# Patient Record
Sex: Female | Born: 1970 | Race: White | Hispanic: No | Marital: Married | Smoking: Never smoker
Health system: Southern US, Community
[De-identification: ages and names within clinical notes are randomized; demographics above are authoritative.]

## PROBLEM LIST (undated history)

## (undated) DIAGNOSIS — G43909 Migraine, unspecified, not intractable, without status migrainosus: Secondary | ICD-10-CM

## (undated) DIAGNOSIS — G8929 Other chronic pain: Secondary | ICD-10-CM

## (undated) DIAGNOSIS — K589 Irritable bowel syndrome without diarrhea: Secondary | ICD-10-CM

## (undated) DIAGNOSIS — I10 Essential (primary) hypertension: Secondary | ICD-10-CM

## (undated) DIAGNOSIS — F329 Major depressive disorder, single episode, unspecified: Secondary | ICD-10-CM

## (undated) DIAGNOSIS — F419 Anxiety disorder, unspecified: Secondary | ICD-10-CM

## (undated) DIAGNOSIS — M3500 Sicca syndrome, unspecified: Secondary | ICD-10-CM

## (undated) DIAGNOSIS — E079 Disorder of thyroid, unspecified: Secondary | ICD-10-CM

## (undated) DIAGNOSIS — M797 Fibromyalgia: Secondary | ICD-10-CM

## (undated) DIAGNOSIS — F32A Depression, unspecified: Secondary | ICD-10-CM

## (undated) DIAGNOSIS — Z87442 Personal history of urinary calculi: Secondary | ICD-10-CM

## (undated) DIAGNOSIS — R51 Headache: Secondary | ICD-10-CM

## (undated) DIAGNOSIS — R519 Headache, unspecified: Secondary | ICD-10-CM

## (undated) DIAGNOSIS — M1990 Unspecified osteoarthritis, unspecified site: Secondary | ICD-10-CM

## (undated) HISTORY — DX: Headache, unspecified: R51.9

## (undated) HISTORY — DX: Unspecified osteoarthritis, unspecified site: M19.90

## (undated) HISTORY — PX: PARTIAL HYSTERECTOMY: SHX80

## (undated) HISTORY — DX: Disorder of thyroid, unspecified: E07.9

## (undated) HISTORY — DX: Irritable bowel syndrome, unspecified: K58.9

## (undated) HISTORY — DX: Personal history of urinary calculi: Z87.442

## (undated) HISTORY — DX: Major depressive disorder, single episode, unspecified: F32.9

## (undated) HISTORY — DX: Headache: R51

## (undated) HISTORY — DX: Anxiety disorder, unspecified: F41.9

## (undated) HISTORY — DX: Fibromyalgia: M79.7

## (undated) HISTORY — PX: DENTAL SURGERY: SHX609

## (undated) HISTORY — DX: Depression, unspecified: F32.A

## (undated) HISTORY — DX: Other chronic pain: G89.29

## (undated) HISTORY — DX: Migraine, unspecified, not intractable, without status migrainosus: G43.909

---
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
## 2001-07-10 ENCOUNTER — Emergency Department (HOSPITAL_COMMUNITY): Admission: EM | Admit: 2001-07-10 | Discharge: 2001-07-10 | Payer: Self-pay | Admitting: *Deleted

## 2002-07-08 ENCOUNTER — Encounter (INDEPENDENT_AMBULATORY_CARE_PROVIDER_SITE_OTHER): Payer: Self-pay | Admitting: *Deleted

## 2002-07-08 ENCOUNTER — Ambulatory Visit (HOSPITAL_BASED_OUTPATIENT_CLINIC_OR_DEPARTMENT_OTHER): Admission: RE | Admit: 2002-07-08 | Discharge: 2002-07-08 | Payer: Self-pay | Admitting: General Surgery

## 2004-09-26 ENCOUNTER — Other Ambulatory Visit: Admission: RE | Admit: 2004-09-26 | Discharge: 2004-09-26 | Payer: Self-pay | Admitting: Family Medicine

## 2005-04-25 ENCOUNTER — Other Ambulatory Visit: Admission: RE | Admit: 2005-04-25 | Discharge: 2005-04-25 | Payer: Self-pay | Admitting: Family Medicine

## 2007-07-22 LAB — CONVERTED CEMR LAB: Pap Smear: NORMAL

## 2007-08-13 ENCOUNTER — Ambulatory Visit (HOSPITAL_COMMUNITY): Admission: RE | Admit: 2007-08-13 | Discharge: 2007-08-13 | Payer: Self-pay | Admitting: Obstetrics and Gynecology

## 2008-04-13 ENCOUNTER — Emergency Department (HOSPITAL_COMMUNITY): Admission: EM | Admit: 2008-04-13 | Discharge: 2008-04-13 | Payer: Self-pay | Admitting: Emergency Medicine

## 2008-06-02 ENCOUNTER — Ambulatory Visit: Payer: Self-pay | Admitting: Internal Medicine

## 2008-06-02 DIAGNOSIS — G43909 Migraine, unspecified, not intractable, without status migrainosus: Secondary | ICD-10-CM | POA: Insufficient documentation

## 2008-06-02 DIAGNOSIS — Z87442 Personal history of urinary calculi: Secondary | ICD-10-CM | POA: Insufficient documentation

## 2008-06-02 DIAGNOSIS — R519 Headache, unspecified: Secondary | ICD-10-CM | POA: Insufficient documentation

## 2008-06-02 DIAGNOSIS — R5381 Other malaise: Secondary | ICD-10-CM | POA: Insufficient documentation

## 2008-06-02 DIAGNOSIS — R5383 Other fatigue: Secondary | ICD-10-CM | POA: Insufficient documentation

## 2008-06-02 DIAGNOSIS — R51 Headache: Secondary | ICD-10-CM | POA: Insufficient documentation

## 2008-06-02 DIAGNOSIS — J309 Allergic rhinitis, unspecified: Secondary | ICD-10-CM | POA: Insufficient documentation

## 2008-06-02 LAB — CONVERTED CEMR LAB
ALT: 20 units/L (ref 0–35)
AST: 25 units/L (ref 0–37)
Albumin: 3.7 g/dL (ref 3.5–5.2)
Alkaline Phosphatase: 68 units/L (ref 39–117)
BUN: 7 mg/dL (ref 6–23)
Basophils Absolute: 0.1 10*3/uL (ref 0.0–0.1)
Basophils Relative: 1.3 % (ref 0.0–3.0)
Bilirubin, Direct: 0.1 mg/dL (ref 0.0–0.3)
CO2: 29 meq/L (ref 19–32)
Calcium: 9.3 mg/dL (ref 8.4–10.5)
Chloride: 106 meq/L (ref 96–112)
Cholesterol: 144 mg/dL (ref 0–200)
Creatinine, Ser: 0.7 mg/dL (ref 0.4–1.2)
Eosinophils Absolute: 0 10*3/uL (ref 0.0–0.7)
Eosinophils Relative: 1 % (ref 0.0–5.0)
Folate: 8.8 ng/mL
Free T4: 0.8 ng/dL (ref 0.6–1.6)
GFR calc Af Amer: 122 mL/min
GFR calc non Af Amer: 101 mL/min
Glucose, Bld: 76 mg/dL (ref 70–99)
HCT: 34.5 % — ABNORMAL LOW (ref 36.0–46.0)
HDL: 32.7 mg/dL — ABNORMAL LOW (ref >39.0)
Hemoglobin: 12.1 g/dL (ref 12.0–15.0)
LDL Cholesterol: 86 mg/dL (ref 0–99)
Lymphocytes Relative: 40.1 % (ref 12.0–46.0)
MCHC: 34.9 g/dL (ref 30.0–36.0)
MCV: 91.7 fL (ref 78.0–100.0)
Monocytes Absolute: 0.4 10*3/uL (ref 0.1–1.0)
Monocytes Relative: 8.1 % (ref 3.0–12.0)
Neutro Abs: 2.5 10*3/uL (ref 1.4–7.7)
Neutrophils Relative %: 49.5 % (ref 43.0–77.0)
Platelets: 292 10*3/uL (ref 150–400)
Potassium: 4.8 meq/L (ref 3.5–5.1)
RBC: 3.76 M/uL — ABNORMAL LOW (ref 3.87–5.11)
RDW: 12.5 % (ref 11.5–14.6)
Sodium: 140 meq/L (ref 135–145)
T3, Free: 3.1 pg/mL (ref 2.3–4.2)
TSH: 1.05 microintl units/mL (ref 0.35–5.50)
Total Bilirubin: 0.6 mg/dL (ref 0.3–1.2)
Total CHOL/HDL Ratio: 4.4
Total Protein: 7.9 g/dL (ref 6.0–8.3)
Triglycerides: 125 mg/dL (ref 0–149)
VLDL: 25 mg/dL (ref 0–40)
Vitamin B-12: 445 pg/mL (ref 211–911)
WBC: 5 10*3/uL (ref 4.5–10.5)

## 2008-06-03 ENCOUNTER — Encounter: Payer: Self-pay | Admitting: Internal Medicine

## 2008-07-15 ENCOUNTER — Ambulatory Visit: Payer: Self-pay | Admitting: Internal Medicine

## 2008-07-15 DIAGNOSIS — L0203 Carbuncle of face: Secondary | ICD-10-CM

## 2008-07-15 DIAGNOSIS — L0202 Furuncle of face: Secondary | ICD-10-CM | POA: Insufficient documentation

## 2008-10-29 HISTORY — PX: OTHER SURGICAL HISTORY: SHX169

## 2008-11-23 ENCOUNTER — Telehealth (INDEPENDENT_AMBULATORY_CARE_PROVIDER_SITE_OTHER): Payer: Self-pay | Admitting: *Deleted

## 2009-06-06 ENCOUNTER — Ambulatory Visit: Payer: Self-pay | Admitting: Internal Medicine

## 2009-06-06 DIAGNOSIS — K5909 Other constipation: Secondary | ICD-10-CM | POA: Insufficient documentation

## 2009-06-06 DIAGNOSIS — L0390 Cellulitis, unspecified: Secondary | ICD-10-CM

## 2009-06-06 DIAGNOSIS — L0291 Cutaneous abscess, unspecified: Secondary | ICD-10-CM | POA: Insufficient documentation

## 2009-06-06 LAB — CONVERTED CEMR LAB
BUN: 12 mg/dL (ref 6–23)
Basophils Absolute: 0 10*3/uL (ref 0.0–0.1)
Basophils Relative: 1 % (ref 0–1)
CO2: 24 meq/L (ref 19–32)
Calcium: 9.2 mg/dL (ref 8.4–10.5)
Chloride: 104 meq/L (ref 96–112)
Creatinine, Ser: 0.78 mg/dL (ref 0.40–1.20)
Eosinophils Absolute: 0.1 10*3/uL (ref 0.0–0.7)
Eosinophils Relative: 2 % (ref 0–5)
Free T4: 0.96 ng/dL (ref 0.80–1.80)
Glucose, Bld: 94 mg/dL (ref 70–99)
HCT: 39.1 % (ref 36.0–46.0)
Hemoglobin: 12.9 g/dL (ref 12.0–15.0)
Lymphocytes Relative: 39 % (ref 12–46)
Lymphs Abs: 2.3 10*3/uL (ref 0.7–4.0)
MCHC: 33 g/dL (ref 30.0–36.0)
MCV: 92.4 fL (ref 78.0–100.0)
Monocytes Absolute: 0.5 10*3/uL (ref 0.1–1.0)
Monocytes Relative: 8 % (ref 3–12)
Neutro Abs: 3.1 10*3/uL (ref 1.7–7.7)
Neutrophils Relative %: 51 % (ref 43–77)
Platelets: 281 10*3/uL (ref 150–400)
Potassium: 4.6 meq/L (ref 3.5–5.3)
RBC: 4.23 M/uL (ref 3.87–5.11)
RDW: 12.9 % (ref 11.5–15.5)
Sodium: 139 meq/L (ref 135–145)
TSH: 1.28 microintl units/mL (ref 0.350–4.500)
WBC: 6 10*3/uL (ref 4.0–10.5)

## 2009-06-08 ENCOUNTER — Telehealth: Payer: Self-pay | Admitting: Internal Medicine

## 2009-08-04 ENCOUNTER — Ambulatory Visit: Payer: Self-pay | Admitting: Gastroenterology

## 2009-09-07 ENCOUNTER — Ambulatory Visit: Payer: Self-pay | Admitting: Family

## 2009-09-07 DIAGNOSIS — J019 Acute sinusitis, unspecified: Secondary | ICD-10-CM | POA: Insufficient documentation

## 2009-09-07 DIAGNOSIS — H6590 Unspecified nonsuppurative otitis media, unspecified ear: Secondary | ICD-10-CM | POA: Insufficient documentation

## 2009-09-12 ENCOUNTER — Ambulatory Visit: Payer: Self-pay | Admitting: Internal Medicine

## 2009-10-29 HISTORY — PX: KNEE ARTHROPLASTY: SHX992

## 2010-11-20 ENCOUNTER — Encounter (HOSPITAL_COMMUNITY): Payer: Self-pay | Admitting: Obstetrics and Gynecology

## 2010-11-28 NOTE — Letter (Signed)
   [place] [facility] [address] [place], [place]  [address]  [place] Phone: [phone]      June 03, 2008   [facility] [address] [place], [place] [address]  RE:  LAB RESULTS  Dear  Ms. Yetta Barre,  The following is an interpretation of your most recent lab tests.  Please take note of any instructions provided or changes to medications that have resulted from your lab work.  ELECTROLYTES:  Good - no changes needed  KIDNEY FUNCTION TESTS:  Good - no changes needed  LIVER FUNCTION TESTS:  Good - no changes needed  LIPID PANEL:  Fair - review at your next visit Triglyceride: 125   Cholesterol: 144   LDL: 86   HDL: 32.7   Chol/HDL%:  4.4 CALC  THYROID STUDIES:  Thyroid studies normal TSH: 1.05     CBC:  Good - no changes needed  B12 level - normal.       Sincerely Yours,    Dr. Thomos Lemons

## 2010-11-28 NOTE — Assessment & Plan Note (Signed)
Summary: NEW PT CPX-[facility]   Vital Signs:  Patient Profile:   40 Years Old Female Height:     70 inches Weight:      200.75 pounds BMI:     28.91 Temp:     98.5 degrees F oral Pulse rate:   80 / minute Pulse rhythm:   regular Resp:     16 per minute BP sitting:   110 / 80  (right arm)  Vitals Entered By: Glendell Docker CMA (June 02, 2008 11:26 AM)                 Chief Complaint:  New Tamara Moore.  History of Present Illness: 40 y/o white female to establish primary care.  Pt has history of chronic migraine headaches.  She gets approximately one headache per week.  Her headaches are preceded by visual aura.  She has associated nausea and photophobia.  She currently takes Excedrin migraine as needed.  Pt also reports history of mild goiter discovered by her GYN.  She reports thyroid u/s normal but discovered small 5 mm hypoechoic nodule.  She complains of recent fatigue and anxiety.  She works as [employer] officer.  She is having trouble concentrating and completing tasks w/o becoming distracted.  Her symptoms has worsened over last 1 year.  She reports chronic hx of difficulty sleeping.  Insomnia also more apparent over last 1 - 2 years.  She is feeling somewhat stressed due to recent wt loss program.  She is exercising frequently.  She has occasional bouts of brief dizziness when she has not eaten for several hours.    Current Allergies (reviewed today): ! ASA  Past Medical History:    History of adjustment disorder with depression - after divorse 5 years.         Pt took wellbutrin for short time period.    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   Head:     normocephalic and atraumatic.   Eyes:     vision grossly intact, pupils equal, pupils round, and pupils reactive to light.   Ears:     R ear normal and L ear normal.   Mouth:     Oral mucosa and oropharynx without lesions or exudates.  Teeth in good repair. Neck:     supple and no masses.  thyroid slightly full.  no nodules Lungs:     normal respiratory effort and normal breath sounds.   Heart:     normal rate, regular rhythm, and  no gallop.   Abdomen:     soft, non-tender, no hepatomegaly, and no splenomegaly.   Pulses:     dorsalis pedis and posterior tibial pulses are full and equal bilaterally Extremities:     No clubbing, cyanosis, edema   Neurologic:     alert & oriented X3 and cranial nerves II-XII intact.   Skin:     turgor normal and color normal.   Psych:     normally interactive and good eye contact.  slightly hyperactive.    Impression & Recommendations:  Problem # 1:  HEALTH MAINTENANCE EXAM (ICD-V70.0) Reviewed adult health maintenance protocols.  Pt is up to date with PAP and Pelvic.   Update with Tdap.  I urged pt to continue wt loss efforts.  Screen for diabetes.    Orders: TLB-BMP (Basic Metabolic Panel-BMET) (80048-METABOL) TLB-B12 + Folate Pnl (16109_60454-U98/JXB) TLB-CBC Platelet - w/Differential (85025-CBCD) TLB-Hepatic/Liver Function Pnl (80076-HEPATIC) TLB-Lipid Panel (80061-LIPID) TLB-TSH (Thyroid Stimulating Hormone) (84443-TSH) TLB-T4 (Thyrox), Free [phone]) TLB-T3, Free (Triiodothyronine) (84481-T3FREE)   Problem # 2:  FATIGUE (ICD-780.79) Pt with poor sleep.  She also mentions difficulty concentrating.  She  is easily distracted.  ? ADD.  We discussed possible referral to psychiatrist for formal testing.  Orders: TLB-BMP (Basic Metabolic Panel-BMET) (80048-METABOL) TLB-B12 + Folate Pnl ([id number]-N62/XBM) TLB-CBC Platelet - w/Differential (85025-CBCD) TLB-Hepatic/Liver Function Pnl (80076-HEPATIC) TLB-Lipid Panel (80061-LIPID) TLB-TSH (Thyroid Stimulating Hormone) (84443-TSH) TLB-T4 (Thyrox), Free (84439-FT4R) TLB-T3, Free (Triiodothyronine) (84481-T3FREE)   Problem # 3:  MIGRAINE HEADACHE (ICD-346.90) Pt reports 1 headache per week.  She uses excedrin migraine.  She has never use med for migraine prophylaxis.  Trial of amitriptyline 25 mg by mouth at bedtime.  We discussed common side effects.  Complete Medication List: 1)  Amitriptyline Hcl 25 Mg Tabs (Amitriptyline hcl) .... One by mouth qhs  Other Orders: Tdap => 90yrs IM (84132) Admin 1st Vaccine (44010)   Patient Instructions: 1)  Please schedule a follow-up appointment in 6 weeks.   Prescriptions: AMITRIPTYLINE HCL 25 MG  TABS (AMITRIPTYLINE HCL) one by mouth qhs  #30 x 1   Entered and Authorized by:   D. Thomos Lemons DO   Signed by:   D. Thomos Lemons DO on 06/02/2008   Method used:   Electronically sent to ...       [facility]       [address]       [place], [place]  [address]       Ph: [phone]       Fax: [phone]   RxID:   [id number]  ] Current Allergies (reviewed today): ! ASA   Preventive Care Screening  Pap Smear:    Date:  07/22/2007    Results:  normal    Tetanus/Td Vaccine    Vaccine Type: Tdap    Site: left deltoid    Mfr: Sanofi Pasteur    Dose: 0.5 ml    Route: IM    Given by: Glendell Docker CMA    Exp. Date: 01/27/2010    Lot #: [id number]    VIS given: 09/16/07 version given June 02, 2008.

## 2011-03-16 NOTE — Op Note (Signed)
   Tamara Moore, Tamara Moore NO.:  [id number]   MEDICAL RECORD NO.:  [id number]                   PATIENT TYPE:  AMB   LOCATION:  DSC                                  FACILITY:  [facility]   PHYSICIAN:  Ollen Gross. Vernell Morgans, M.D.              DATE OF BIRTH:  December 17, 1970   DATE OF PROCEDURE:  07/12/2002  DATE OF DISCHARGE:  07/08/2002                                 OPERATIVE REPORT   PREOPERATIVE DIAGNOSIS:  Mass on the right buttock.   POSTOPERATIVE DIAGNOSIS:  Mass on the right buttock.   PROCEDURE:  Excision of 2 cm mass from the right buttock.   SURGEON:  Ollen Gross. Carolynne Edouard, M.D.   ANESTHESIA:  Local.   DESCRIPTION OF PROCEDURE:  After informed consent was obtained, the patient  was brought to the operating room and placed in the prone position on the  operating table.  The area in question was prepped with Betadine and draped  in the usual sterile manner.  Then 1% lidocaine with epinephrine was then  injected into the surrounding tissue until a good field block was obtained.  A longitudinally oriented incision was made overlying the mass.  This  incision was carried down through the skin and subcutaneous tissue with the  knife.  The mass was then removed by sharp dissection with the Metzenbaum  scissors until it was completely separated from the rest of the tissue.  The  wound was then examined and found to be hemostatic.  The deep layers were  closed with interrupted 4-0 Monocryl stitches and a Dermabond dressing was  applied.  The patient tolerated the procedure well.  At the end of the case,  all needle, sponge and instrument counts were correct.  The patient was then  taken to the recovery room in stable condition.                                               Ollen Gross. Vernell Morgans, M.D.    PST/MEDQ  D:  07/12/2002  T:  07/12/2002  Job:  [id number]

## 2011-07-26 LAB — URINE CULTURE: Colony Count: 15000

## 2011-07-26 LAB — DIFFERENTIAL
Basophils Absolute: 0
Basophils Relative: 0
Eosinophils Absolute: 0
Eosinophils Relative: 0
Lymphocytes Relative: 11 — ABNORMAL LOW
Lymphs Abs: 1
Monocytes Absolute: 0.3
Monocytes Relative: 3
Neutro Abs: 7.9 — ABNORMAL HIGH
Neutrophils Relative %: 86 — ABNORMAL HIGH

## 2011-07-26 LAB — POCT I-STAT, CHEM 8
BUN: 9
Calcium, Ion: 1.14
Chloride: 106
Creatinine, Ser: 0.8
Glucose, Bld: 125 — ABNORMAL HIGH
HCT: 33 — ABNORMAL LOW
Hemoglobin: 11.2 — ABNORMAL LOW
Potassium: 3.5
Sodium: 140
TCO2: 24

## 2011-07-26 LAB — CBC
HCT: 31.8 — ABNORMAL LOW
Hemoglobin: 11.1 — ABNORMAL LOW
MCHC: 34.7
MCV: 88.9
Platelets: 258
RBC: 3.58 — ABNORMAL LOW
RDW: 13.9
WBC: 9.1

## 2011-07-26 LAB — URINALYSIS, ROUTINE W REFLEX MICROSCOPIC
Bilirubin Urine: NEGATIVE
Glucose, UA: NEGATIVE
Ketones, ur: NEGATIVE
Nitrite: NEGATIVE
Protein, ur: 30 — AB
Specific Gravity, Urine: 1.022
Urobilinogen, UA: 0.2
pH: 5.5

## 2011-07-26 LAB — URINE MICROSCOPIC-ADD ON

## 2011-07-26 LAB — POCT PREGNANCY, URINE
Operator id: 295131
Preg Test, Ur: NEGATIVE

## 2011-09-17 ENCOUNTER — Ambulatory Visit (INDEPENDENT_AMBULATORY_CARE_PROVIDER_SITE_OTHER): Payer: BC Managed Care – PPO | Admitting: Internal Medicine

## 2011-09-17 ENCOUNTER — Encounter: Payer: Self-pay | Admitting: Internal Medicine

## 2011-09-17 ENCOUNTER — Ambulatory Visit: Payer: Self-pay | Admitting: Internal Medicine

## 2011-09-17 DIAGNOSIS — R112 Nausea with vomiting, unspecified: Secondary | ICD-10-CM

## 2011-09-17 DIAGNOSIS — R51 Headache: Secondary | ICD-10-CM

## 2011-09-17 DIAGNOSIS — B9789 Other viral agents as the cause of diseases classified elsewhere: Secondary | ICD-10-CM

## 2011-09-17 DIAGNOSIS — B349 Viral infection, unspecified: Secondary | ICD-10-CM | POA: Insufficient documentation

## 2011-09-17 LAB — SEDIMENTATION RATE: Sed Rate: 34 mm/hr — ABNORMAL HIGH (ref 0–22)

## 2011-09-17 LAB — CBC WITH DIFFERENTIAL/PLATELET
Basophils Absolute: 0 10*3/uL (ref 0.0–0.1)
Basophils Relative: 0.1 % (ref 0.0–3.0)
Eosinophils Absolute: 0 10*3/uL (ref 0.0–0.7)
Eosinophils Relative: 0.2 % (ref 0.0–5.0)
HCT: 35.8 % — ABNORMAL LOW (ref 36.0–46.0)
Hemoglobin: 12 g/dL (ref 12.0–15.0)
Lymphocytes Relative: 13.4 % (ref 12.0–46.0)
Lymphs Abs: 0.6 10*3/uL — ABNORMAL LOW (ref 0.7–4.0)
MCHC: 33.7 g/dL (ref 30.0–36.0)
MCV: 91.1 fl (ref 78.0–100.0)
Monocytes Absolute: 0.2 10*3/uL (ref 0.1–1.0)
Monocytes Relative: 4 % (ref 3.0–12.0)
Neutro Abs: 3.6 10*3/uL (ref 1.4–7.7)
Neutrophils Relative %: 82.3 % — ABNORMAL HIGH (ref 43.0–77.0)
Platelets: 287 10*3/uL (ref 150.0–400.0)
RBC: 3.93 Mil/uL (ref 3.87–5.11)
RDW: 12.9 % (ref 11.5–14.6)
WBC: 4.4 10*3/uL — ABNORMAL LOW (ref 4.5–10.5)

## 2011-09-17 LAB — BASIC METABOLIC PANEL
BUN: 10 mg/dL (ref 6–23)
CO2: 26 mEq/L (ref 19–32)
Calcium: 8.5 mg/dL (ref 8.4–10.5)
Chloride: 103 mEq/L (ref 96–112)
Creatinine, Ser: 0.6 mg/dL (ref 0.4–1.2)
GFR: 115.74 mL/min (ref >60.00)
Glucose, Bld: 114 mg/dL — ABNORMAL HIGH (ref 70–99)
Potassium: 4.2 mEq/L (ref 3.5–5.1)
Sodium: 135 mEq/L (ref 135–145)

## 2011-09-17 MED ORDER — ONDANSETRON HCL 4 MG PO TABS: 4.0000 mg | ORAL_TABLET | Freq: Three times a day (TID) | ORAL | Status: AC | PRN

## 2011-09-17 MED ORDER — CEFUROXIME AXETIL 500 MG PO TABS: 500.0000 mg | ORAL_TABLET | Freq: Two times a day (BID) | ORAL | Status: DC

## 2011-09-17 MED ORDER — CEFUROXIME AXETIL 500 MG PO TABS: 500.0000 mg | ORAL_TABLET | Freq: Two times a day (BID) | ORAL | Status: AC

## 2011-09-17 MED ORDER — HYDROCODONE-ACETAMINOPHEN 5-500 MG PO CAPS: 1.0000 | ORAL_CAPSULE | Freq: Three times a day (TID) | ORAL | Status: AC | PRN

## 2011-09-17 MED ORDER — PROMETHAZINE HCL 25 MG/ML IJ SOLN
12.5000 mg | Freq: Once | INTRAMUSCULAR | Status: AC
  Administered 2011-09-17: 12.5 mg via INTRAMUSCULAR

## 2011-09-17 NOTE — Progress Notes (Signed)
Addended by: Simeon Craft on: 09/17/2011 06:08 PM   Modules accepted: Orders

## 2011-09-17 NOTE — Patient Instructions (Signed)
Increase fluid intake Take nausea medication as directed. Use imodium over the counter for diarrhea Please call our office if your symptoms do not improve or gets worse within next 24 - 48 hrs.

## 2011-09-17 NOTE — Progress Notes (Signed)
Addended by: Alfred Levins D on: 09/17/2011 03:32 PM   Modules accepted: Orders

## 2011-09-17 NOTE — Progress Notes (Signed)
  Subjective:    Patient ID: Tamara Moore, female    DOB: 1971/03/30, 40 y.o.   MRN: [id number]  HPI  40 year old white female complains of headache x2 days. She has history of migraines but reports this headache feels different. She has had associated nausea and vomiting that started yesterday as well as diarrhea 4-5 times per day. She denies blood in her stool. She denies fever. Severity of headache is rated 8/10. It is currently left-sided. She does have associated photophobia. She denies sick contacts. She took some Advil migraine yesterday which did alleviate her headache somewhat.  Review of Systems  she feels achy all over  No past medical history on file.  History   Social History  . Marital Status: Divorced    Spouse Name: N/A    Number of Children: N/A  . Years of Education: N/A   Occupational History  . Not on file.   Social History Main Topics  . Smoking status: Never Smoker   . Smokeless tobacco: Not on file  . Alcohol Use: Yes  . Drug Use: No  . Sexually Active: Not on file   Other Topics Concern  . Not on file   Social History Narrative  . No narrative on file    Past Surgical History  Procedure Date  . Cesarean section   . Eye surgery 1979    Family History  Problem Relation Age of Onset  . Stroke Mother   . Hypertension Mother   . Hyperlipidemia Mother   . Hypertension Father   . Hyperlipidemia Father     Allergies  Allergen Reactions  . Aspirin     No current outpatient prescriptions on file prior to visit.    BP 132/94  Temp(Src) 97.8 F (36.6 C) (Oral)  Ht 5\' 10"  (1.778 m)  Wt 184 lb (83.462 kg)  BMI 26.40 kg/m2       Objective:   Physical Exam  Constitutional: She appears well-developed and well-nourished.  HENT:  Head: Normocephalic and atraumatic.  Right Ear: External ear normal.  Left Ear: External ear normal.  Eyes: Conjunctivae are normal. Pupils are equal, round, and reactive to light.  Neck: Normal range of  motion. Neck supple.  Cardiovascular: Normal rate, regular rhythm and normal heart sounds.   Pulmonary/Chest: Effort normal and breath sounds normal.  Abdominal: Soft. She exhibits no mass. There is no rebound and no guarding.       Mild lower abdominal tenderness  Lymphadenopathy:    She has no cervical adenopathy.  Neurological: She is alert. No cranial nerve deficit. Coordination normal.  Skin: Skin is warm and dry.       Assessment & Plan:

## 2011-09-17 NOTE — Progress Notes (Signed)
Addended by: Simeon Craft on: 09/17/2011 06:18 PM   Modules accepted: Orders

## 2011-09-17 NOTE — Assessment & Plan Note (Addendum)
40 year old white female who presents with severe headache, nausea vomiting and diarrhea. I suspect her symptoms are consistent with a viral syndrome. I advised patient to increase fluid intake use anti-emetics and Imodium over-the-counter as directed.  Patient phenergan 12.5 mg IM x 1. Check CBC differential, sed rate and basic metabolic panel. Patient advised to call office if symptoms persist or worsen.  Addendum 09/17/2011 Pt has slightly left shift on CBCD and mild elevated sed rate.  Pt advised to take cefuroxime 500 mg bid x 10 days.

## 2011-09-18 ENCOUNTER — Emergency Department (HOSPITAL_COMMUNITY)
Admission: EM | Admit: 2011-09-18 | Discharge: 2011-09-19 | Disposition: A | Discharge status: Home / Self Care | Payer: BC Managed Care – PPO | Attending: Emergency Medicine | Admitting: Emergency Medicine

## 2011-09-18 ENCOUNTER — Encounter (HOSPITAL_COMMUNITY): Payer: Self-pay | Admitting: Emergency Medicine

## 2011-09-18 DIAGNOSIS — Z79899 Other long term (current) drug therapy: Secondary | ICD-10-CM | POA: Insufficient documentation

## 2011-09-18 DIAGNOSIS — M2569 Stiffness of other specified joint, not elsewhere classified: Secondary | ICD-10-CM | POA: Insufficient documentation

## 2011-09-18 DIAGNOSIS — R6883 Chills (without fever): Secondary | ICD-10-CM | POA: Insufficient documentation

## 2011-09-18 DIAGNOSIS — H53149 Visual discomfort, unspecified: Secondary | ICD-10-CM | POA: Insufficient documentation

## 2011-09-18 DIAGNOSIS — Z9889 Other specified postprocedural states: Secondary | ICD-10-CM | POA: Insufficient documentation

## 2011-09-18 DIAGNOSIS — R112 Nausea with vomiting, unspecified: Secondary | ICD-10-CM | POA: Insufficient documentation

## 2011-09-18 DIAGNOSIS — G43909 Migraine, unspecified, not intractable, without status migrainosus: Secondary | ICD-10-CM | POA: Insufficient documentation

## 2011-09-18 DIAGNOSIS — R630 Anorexia: Secondary | ICD-10-CM | POA: Insufficient documentation

## 2011-09-18 MED ORDER — DEXAMETHASONE SODIUM PHOSPHATE 10 MG/ML IJ SOLN
10.0000 mg | Freq: Once | INTRAMUSCULAR | Status: AC
  Administered 2011-09-19: 10 mg via INTRAVENOUS
  Filled 2011-09-18: qty 1

## 2011-09-18 MED ORDER — SODIUM CHLORIDE 0.9 % IV BOLUS (SEPSIS)
1000.0000 mL | Freq: Once | INTRAVENOUS | Status: AC
  Administered 2011-09-19: 1000 mL via INTRAVENOUS

## 2011-09-18 MED ORDER — DIPHENHYDRAMINE HCL 50 MG/ML IJ SOLN
25.0000 mg | Freq: Once | INTRAMUSCULAR | Status: AC
  Administered 2011-09-19: 25 mg via INTRAVENOUS
  Filled 2011-09-18: qty 1

## 2011-09-18 MED ORDER — METOCLOPRAMIDE HCL 5 MG/ML IJ SOLN
10.0000 mg | Freq: Once | INTRAMUSCULAR | Status: AC
  Administered 2011-09-19: 10 mg via INTRAVENOUS
  Filled 2011-09-18: qty 2

## 2011-09-18 NOTE — ED Notes (Signed)
PT. REPORTS HEADACHE ONSET 3 DAYS AGO WITH PHOTOSENSITIVITY , BACK OF NECK PAIN ,  VOMITTING /DIARRHEA,  FEVER , DENIES CHILLS , SEEN BY DR. Artist Pais , PRESCRIBED WITH ANTIEMETIC AND PAIN MEDICATIONS / ANTIBIOTICS FOR ELEVATED WBC.

## 2011-09-18 NOTE — ED Provider Notes (Signed)
History     CSN: [id number] Arrival date & time: 09/18/2011 10:36 PM   First MD Initiated Contact with Patient 09/18/11 2329      Chief Complaint  Patient presents with  . Headache    (Consider location/radiation/quality/duration/timing/severity/associated sxs/prior treatment) HPI Comments: Pt placed on abx on Monday for leukocytosis and HA by PCP however looking at chart WBC count was 4.4  Patient is a 40 y.o. female presenting with headaches. The history is provided by the patient and the spouse.  Headache  This is a recurrent (this one is different from normal) problem. The current episode started more than 2 days ago. The problem occurs constantly. The problem has been gradually worsening. The headache is associated with bright light, loud noise and the menstrual cycle. The pain is located in the left unilateral region. The quality of the pain is described as sharp and throbbing. The pain is at a severity of 9/10. The pain is severe. The pain radiates to the left neck and right neck. Associated symptoms include anorexia, nausea and vomiting. Pertinent negatives include no fever, no palpitations and no shortness of breath. Associated symptoms comments: chills. She has tried acetaminophen, oral narcotic analgesics and NSAIDs for the symptoms. The treatment provided no relief.    History reviewed. No pertinent past medical history.  Past Surgical History  Procedure Date  . Cesarean section   . Eye surgery 1979  . Knee arthroplasty     Family History  Problem Relation Age of Onset  . Stroke Mother   . Hypertension Mother   . Hyperlipidemia Mother   . Hypertension Father   . Hyperlipidemia Father     History  Substance Use Topics  . Smoking status: Never Smoker   . Smokeless tobacco: Not on file  . Alcohol Use: Yes    OB History    Grav Para Term Preterm Abortions TAB SAB Ect Mult Living                  Review of Systems  Constitutional: Positive for chills.  Negative for fever.  HENT: Positive for neck stiffness.   Respiratory: Negative for shortness of breath.   Cardiovascular: Negative for palpitations.  Gastrointestinal: Positive for nausea, vomiting and anorexia.  Neurological: Positive for headaches.  All other systems reviewed and are negative.    Allergies  Aspirin  Home Medications   Current Outpatient Rx  Name Route Sig Dispense Refill  . CEFUROXIME AXETIL 500 MG PO TABS Oral Take 1 tablet (500 mg total) by mouth 2 (two) times daily. 20 tablet 0  . HYDROCODONE-ACETAMINOPHEN 5-500 MG PO CAPS Oral Take 1 capsule by mouth every 8 (eight) hours as needed (Headache). 30 capsule 0  . ONDANSETRON HCL 4 MG PO TABS Oral Take 1 tablet (4 mg total) by mouth every 8 (eight) hours as needed for nausea. 30 tablet 0    BP 137/93  Pulse 95  Temp 98 F (36.7 C)  Resp 20  SpO2 96%  Physical Exam  Nursing note and vitals reviewed. Constitutional: She is oriented to person, place, and time. She appears well-developed and well-nourished. She appears distressed.  HENT:  Head: Normocephalic and atraumatic.  Eyes: Conjunctivae and EOM are normal. Pupils are equal, round, and reactive to light.  Neck: Normal range of motion. Neck supple.       Pain with flexion of the neck but not rigid and no meningeal signs  Cardiovascular: Normal rate, regular rhythm, normal heart sounds and intact distal  pulses.  Exam reveals no friction rub.   No murmur heard. Pulmonary/Chest: Effort normal and breath sounds normal. She has no wheezes. She has no rales.  Abdominal: Soft. Bowel sounds are normal. She exhibits no distension. There is no tenderness. There is no rebound and no guarding.  Musculoskeletal: Normal range of motion. She exhibits no tenderness.       No edema  Lymphadenopathy:    She has no cervical adenopathy.  Neurological: She is alert and oriented to person, place, and time. She has normal strength. No cranial nerve deficit or sensory  deficit. Coordination and gait normal.       photophobia  Skin: Skin is warm and dry. No rash noted.  Psychiatric: She has a normal mood and affect. Her behavior is normal.    ED Course  Procedures (including critical care time)   Labs Reviewed  CBC  DIFFERENTIAL  BASIC METABOLIC PANEL  CSF CELL COUNT WITH DIFFERENTIAL  PROTEIN AND GLUCOSE, CSF  CSF CULTURE   Ct Head Wo Contrast  09/19/2011  *RADIOLOGY REPORT*  Clinical Data: Headaches.  Nausea and vomiting.  CT HEAD WITHOUT CONTRAST 09/19/2011:  Technique:  Contiguous axial images were obtained from the base of the skull through the vertex without contrast.  Comparison: None.  Findings: Ventricular system normal in size and appearance for age. No mass lesion.  No midline shift.  No acute hemorrhage or hematoma.  No extra-axial fluid collections.  No evidence of acute infarction.  No focal brain parenchymal abnormalities.  No focal osseous abnormalities involving the skull.  Visualized paranasal sinuses, mastoid air cells, and middle ear cavities well- aerated.  IMPRESSION: Normal examination.  Original Report Authenticated By: Arnell Sieving, M.D.     1. Migraine       MDM   Pt with symptoms of a migraine HA with hx of multiple migraines in the past sx not suggestive of SAH(sudden onset, worst of life, or deficits) or cavernous vein thrombosis.  Normal neuro exam and vital signs.  However pt having mild neck pain and stiffness but no meningeal signs and states different from her normal migraines and intermittent chills but no documented fever and had leukocytosis at PCP on Monday and was started on abx despite no URI sx and state only 1-2 episodes of diarrhea.  However looking back at the note normal WBC on Monday and was checked twice so unclear why started on abx.  Will get CBC, BMP, and CT of head. Will give HA cocktail and on re-eval.  2:16 AM Labs wnl.  Head CT neg.  Pt feeling much better and no neck stiffness or  tenderness.  Feel most likely this is bad migraine and doubt infectious etiology.  Will d/c home.      Gwyneth Sprout, MD 09/19/11 [phone]

## 2011-09-19 ENCOUNTER — Telehealth: Payer: Self-pay | Admitting: *Deleted

## 2011-09-19 ENCOUNTER — Emergency Department (HOSPITAL_COMMUNITY): Payer: BC Managed Care – PPO

## 2011-09-19 LAB — DIFFERENTIAL
Basophils Absolute: 0 10*3/uL (ref 0.0–0.1)
Basophils Relative: 0 % (ref 0–1)
Eosinophils Absolute: 0.1 10*3/uL (ref 0.0–0.7)
Eosinophils Relative: 2 % (ref 0–5)
Lymphocytes Relative: 43 % (ref 12–46)
Lymphs Abs: 2.1 10*3/uL (ref 0.7–4.0)
Monocytes Absolute: 0.5 10*3/uL (ref 0.1–1.0)
Monocytes Relative: 9 % (ref 3–12)
Neutro Abs: 2.3 10*3/uL (ref 1.7–7.7)
Neutrophils Relative %: 46 % (ref 43–77)

## 2011-09-19 LAB — BASIC METABOLIC PANEL
BUN: 10 mg/dL (ref 6–23)
CO2: 29 mEq/L (ref 19–32)
Calcium: 8.8 mg/dL (ref 8.4–10.5)
Chloride: 102 mEq/L (ref 96–112)
Creatinine, Ser: 0.71 mg/dL (ref 0.50–1.10)
GFR calc Af Amer: >90 mL/min (ref >90)
GFR calc non Af Amer: >90 mL/min (ref >90)
Glucose, Bld: 97 mg/dL (ref 70–99)
Potassium: 3.7 mEq/L (ref 3.5–5.1)
Sodium: 138 mEq/L (ref 135–145)

## 2011-09-19 LAB — CBC
HCT: 35.3 % — ABNORMAL LOW (ref 36.0–46.0)
Hemoglobin: 12 g/dL (ref 12.0–15.0)
MCH: 30 pg (ref 26.0–34.0)
MCHC: 34 g/dL (ref 30.0–36.0)
MCV: 88.3 fL (ref 78.0–100.0)
Platelets: 263 10*3/uL (ref 150–400)
RBC: 4 MIL/uL (ref 3.87–5.11)
RDW: 12.4 % (ref 11.5–15.5)
WBC: 5 10*3/uL (ref 4.0–10.5)

## 2011-09-19 NOTE — Telephone Encounter (Signed)
Left message for pt to call and let us know how she is feeling per Dr. Artist Pais.

## 2011-09-21 NOTE — Telephone Encounter (Signed)
Left message again.

## 2011-09-25 NOTE — Telephone Encounter (Signed)
Patient ended up going to the ER.  They did a CT-Scan, labs, and gave her IV fluids.  They diagnosed her with a migraine headache.  She is still having symptoms of some headache, neck soreness, and no energy.  She would like to schedule a follow up or physical for complete blood work.  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  Is this okay to schedule?

## 2011-09-25 NOTE — Telephone Encounter (Signed)
Left message on machine for patient  To return our call 

## 2011-09-26 NOTE — Telephone Encounter (Signed)
Lmom for pt to call back. 

## 2011-09-26 NOTE — Telephone Encounter (Signed)
Yes, ok to schedule cpx

## 2011-09-28 NOTE — Telephone Encounter (Signed)
lmom for pt to call back

## 2011-10-02 NOTE — Telephone Encounter (Signed)
lmom for pt to call back

## 2011-10-10 NOTE — Telephone Encounter (Signed)
lmom for pt to call back

## 2011-10-30 HISTORY — PX: PARTIAL HYSTERECTOMY: SHX80

## 2011-11-02 ENCOUNTER — Telehealth: Payer: Self-pay | Admitting: Internal Medicine

## 2011-11-02 NOTE — Telephone Encounter (Signed)
Ok with me 

## 2011-11-02 NOTE — Telephone Encounter (Signed)
Pt would like to switch from Dr Artist Pais to NP Orvan Falconer. Pt would like a female doctor. Can I sch?

## 2011-11-05 ENCOUNTER — Encounter: Payer: Self-pay | Admitting: Gastroenterology

## 2011-11-05 NOTE — Telephone Encounter (Signed)
lmom for pt to call back

## 2011-11-05 NOTE — Telephone Encounter (Signed)
Pt has ov with NP tomorrow

## 2011-11-06 ENCOUNTER — Ambulatory Visit (INDEPENDENT_AMBULATORY_CARE_PROVIDER_SITE_OTHER): Payer: BC Managed Care – PPO | Admitting: Family

## 2011-11-06 ENCOUNTER — Encounter: Payer: Self-pay | Admitting: Family

## 2011-11-06 VITALS — BP 118/60 | Temp 98.3°F | Wt 184.0 lb

## 2011-11-06 DIAGNOSIS — K589 Irritable bowel syndrome without diarrhea: Secondary | ICD-10-CM

## 2011-11-06 DIAGNOSIS — G43909 Migraine, unspecified, not intractable, without status migrainosus: Secondary | ICD-10-CM

## 2011-11-06 DIAGNOSIS — F439 Reaction to severe stress, unspecified: Secondary | ICD-10-CM

## 2011-11-06 DIAGNOSIS — L239 Allergic contact dermatitis, unspecified cause: Secondary | ICD-10-CM

## 2011-11-06 DIAGNOSIS — L259 Unspecified contact dermatitis, unspecified cause: Secondary | ICD-10-CM

## 2011-11-06 DIAGNOSIS — Z733 Stress, not elsewhere classified: Secondary | ICD-10-CM

## 2011-11-06 LAB — TSH: TSH: 1.05 u[IU]/mL (ref 0.35–5.50)

## 2011-11-06 MED ORDER — AMITRIPTYLINE HCL 10 MG PO TABS: 10.0000 mg | ORAL_TABLET | Freq: Every day | ORAL | Status: DC

## 2011-11-06 NOTE — Progress Notes (Signed)
Subjective:    Patient ID: Tamara Moore, female    DOB: 1971-01-16, 41 y.o.   MRN: [id number]  HPI 41 year old white female, is in today with complaints of headaches that have been occurring about once a week since November, lasting all day. The headaches are accompanied by nausea but denies any vomiting. They typically occur on the right side of her head. She has a history of migraine headaches. She had a CT scan in November that was negative. She was seen by Dr. Artist Pais who started her on Norco when necessary for the headaches, but medication was ineffective. Patient reports feeling more emotional, and even depressed at times. Denies any anxiousness. She also complains of a rash that is well the appearance, that occurs when she scratches her torso.  Patient also has complaints of abdominal pain across the lower abdominal area, that sore to touch. The abdominal pain has been ongoing x1 week.  She describes the pain as cramping, it comes and goes. She has bouts of constipation and has been prescribed MiraLax in the past that wasn't effective. She has a bowel movement approximately once a week. Denies any blood in her stools. She has an appointment to see the gastroenterologist next week.   Review of Systems  Constitutional: Negative.   Eyes: Negative.   Respiratory: Negative.   Cardiovascular: Negative.   Gastrointestinal: Positive for abdominal pain.  Genitourinary: Negative.   Musculoskeletal: Negative.   Skin: Positive for rash.  Neurological: Positive for headaches.  Hematological: Negative.   Psychiatric/Behavioral:       Feelings of depression at times.   No past medical history on file.  History   Social History  . Marital Status: Divorced    Spouse Name: N/A    Number of Children: N/A  . Years of Education: N/A   Occupational History  . Not on file.   Social History Main Topics  . Smoking status: Never Smoker   . Smokeless tobacco: Not on file  . Alcohol Use: Yes  .  Drug Use: No  . Sexually Active: Not on file   Other Topics Concern  . Not on file   Social History Narrative  . No narrative on file    Past Surgical History  Procedure Date  . Cesarean section   . Eye surgery 1979  . Knee arthroplasty     Family History  Problem Relation Age of Onset  . Stroke Mother   . Hypertension Mother   . Hyperlipidemia Mother   . Hypertension Father   . Hyperlipidemia Father     Allergies  Allergen Reactions  . Aspirin     No current outpatient prescriptions on file prior to visit.    BP 118/60  Temp(Src) 98.3 F (36.8 C) (Oral)  Wt 184 lb (83.462 kg)  LMP 12/20/2012chart    Objective:   Physical Exam  Constitutional: She is oriented to person, place, and time. She appears well-developed and well-nourished.  HENT:  Right Ear: External ear normal.  Left Ear: External ear normal.  Nose: Nose normal.  Mouth/Throat: Oropharynx is clear and moist.  Eyes: Conjunctivae are normal.  Neck: Normal range of motion. Neck supple.  Cardiovascular: Normal rate, regular rhythm and normal heart sounds.   Pulmonary/Chest: Effort normal and breath sounds normal.  Abdominal: Soft. There is tenderness.       Tenderness to palpation of the lower abdominal area particularly the left lower quadrant. No rebound tenderness or guarding  Musculoskeletal: Normal range of motion.  Neurological:  She is alert and oriented to person, place, and time.  Skin: Skin is warm and dry.  Psychiatric: She has a normal mood and affect.          Assessment & Plan:  Assessment Abdominal pain, constipation, migraine headaches, stress  Plan: Amitriptyline 10 mg once daily at bedtime. We used this as a migraine preventative medicine as well as to help with sleep, and possibly lighten her mood. Magnesium titrate one bottle by mouth x1. Patient is advised to consider an over-the-counter stool softener. If she is no better she is to recheck with GI as scheduled. I will see  her back here in 2 weeks to reach back migraine headache status and stress.

## 2011-11-06 NOTE — Patient Instructions (Signed)
1. Magnesium Citrate 1 bottle, x 1.  Migraine Headache A migraine headache is an intense, throbbing pain on one or both sides of your head. The exact cause of a migraine headache is not always known. A migraine may be caused when nerves in the brain become irritated and release chemicals that cause swelling within blood vessels, causing pain. Many migraine sufferers have a family history of migraines. Before you get a migraine you may or may not get an aura. An aura is a group of symptoms that can predict the beginning of a migraine. An aura may include:  Visual changes such as:   Flashing lights.   Bright spots or zig-zag lines.   Tunnel vision.   Feelings of numbness.   Trouble talking.   Muscle weakness.  SYMPTOMS  Pain on one or both sides of your head.   Pain that is pulsating or throbbing in nature.   Pain that is severe enough to prevent daily activities.   Pain that is aggravated by any daily physical activity.   Nausea (feeling sick to your stomach), vomiting, or both.   Pain with exposure to bright lights, loud noises, or activity.   General sensitivity to bright lights or loud noises.  MIGRAINE TRIGGERS Examples of triggers of migraine headaches include:   Alcohol.   Smoking.   Stress.   It may be related to menses (female menstruation).   Aged cheeses.   Foods or drinks that contain nitrates, glutamate, aspartame, or tyramine.   Lack of sleep.   Chocolate.   Caffeine.   Hunger.   Medications such as nitroglycerine (used to treat chest pain), birth control pills, estrogen, and some blood pressure medications.  DIAGNOSIS  A migraine headache is often diagnosed based on:  Symptoms.   Physical examination.   A computerized X-ray scan (computed tomography, CT) of your head.  TREATMENT  Medications can help prevent migraines if they are recurrent or should they become recurrent. Your caregiver can help you with a medication or treatment program  that will be helpful to you.   Lying down in a dark, quiet room may be helpful.   Keeping a headache diary may help you find a trend as to what may be triggering your headaches.  SEEK IMMEDIATE MEDICAL CARE IF:   You have confusion, personality changes or seizures.   You have headaches that wake you from sleep.   You have an increased frequency in your headaches.   You have a stiff neck.   You have a loss of vision.   You have muscle weakness.   You start losing your balance or have trouble walking.   You feel faint or pass out.  MAKE SURE YOU:   Understand these instructions.   Will watch your condition.   Will get help right away if you are not doing well or get worse.  Document Released: 10/15/2005 Document Revised: 06/27/2011 Document Reviewed: 05/31/2009 [facility] Patient Information 2012 [place], [place].   Stress Stress-related medical problems are becoming increasingly common. The body has a built-in physical response to stressful situations. Faced with pressure, challenge or danger, we need to react quickly. Our bodies release hormones such as cortisol and adrenaline to help do this. These hormones are part of the "fight or flight" response and affect the metabolic rate, heart rate and blood pressure, resulting in a heightened, stressed state that prepares the body for optimum performance in dealing with a stressful situation. It is likely that early man required these mechanisms  to stay alive, but usually modern stresses do not call for this, and the same hormones released in today's world can damage health and reduce coping ability. CAUSES  Pressure to perform at work, at school or in sports.   Threats of physical violence.   Money worries.   Arguments.   Family conflicts.   Divorce or separation from significant other.   Bereavement.   New job or unemployment.   Changes in location.   Alcohol or drug abuse.  SOMETIMES, THERE IS NO PARTICULAR REASON  FOR DEVELOPING STRESS. Almost all people are at risk of being stressed at some time in their lives. It is important to know that some stress is temporary and some is long term.  Temporary stress will go away when a situation is resolved. Most people can cope with short periods of stress, and it can often be relieved by relaxing, taking a walk, chatting through issues with friends, or having a good night's sleep.   Chronic (long-term, continuous) stress is much harder to deal with. It can be psychologically and emotionally damaging. It can be harmful both for an individual and for friends and family.  SYMPTOMS Everyone reacts to stress differently. There are some common effects that help [place] recognize it. In times of extreme stress, people may:  Shake uncontrollably.   Breathe faster and deeper than normal (hyperventilate).   Vomit.   For people with asthma, stress can trigger an attack.   For some people, stress may trigger migraine headaches, ulcers, and body pain.  PHYSICAL EFFECTS OF STRESS MAY INCLUDE:  Loss of energy.   Skin problems.   Aches and pains resulting from tense muscles, including neck ache, backache and tension headaches.   Increased pain from arthritis and other conditions.   Irregular heart beat (palpitations).   Periods of irritability or anger.   Apathy or depression.   Anxiety (feeling uptight or worrying).   Unusual behavior.   Loss of appetite.   Comfort eating.   Lack of concentration.   Loss of, or decreased, sex-drive.   Increased smoking, drinking, or recreational drug use.   For women, missed periods.   Ulcers, joint pain, and muscle pain.  Post-traumatic stress is the stress caused by any serious accident, strong emotional damage, or extremely difficult or violent experience such as rape or war. Post-traumatic stress victims can experience mixtures of emotions such as fear, shame, depression, guilt or anger. It may include recurrent  memories or images that may be haunting. These feelings can last for weeks, months or even years after the traumatic event that triggered them. Specialized treatment, possibly with medicines and psychological therapies, is available. If stress is causing physical symptoms, severe distress or making it difficult for you to function as normal, it is worth seeing your caregiver. It is important to remember that although stress is a usual part of life, extreme or prolonged stress can lead to other illnesses that will need treatment. It is better to visit a doctor sooner rather than later. Stress has been linked to the development of high blood pressure and heart disease, as well as insomnia and depression. There is no diagnostic test for stress since everyone reacts to it differently. But a caregiver will be able to spot the physical symptoms, such as:  Headaches.   Shingles.   Ulcers.  Emotional distress such as intense worry, low mood or irritability should be detected when the doctor asks pertinent questions to identify any underlying problems that might be  the cause. In case there are physical reasons for the symptoms, the doctor may also want to do some tests to exclude certain conditions. If you feel that you are suffering from stress, try to identify the aspects of your life that are causing it. Sometimes you may not be able to change or avoid them, but even a small change can have a positive ripple effect. A simple lifestyle change can make all the difference. STRATEGIES THAT CAN HELP DEAL WITH STRESS:  Delegating or sharing responsibilities.   Avoiding confrontations.   Learning to be more assertive.   Regular exercise.   Avoid using alcohol or street drugs to cope.   Eating a healthy, balanced diet, rich in fruit and vegetables and proteins.   Finding humor or absurdity in stressful situations.   Never taking on more than you know you can handle comfortably.   Organizing your time  better to get as much done as possible.   Talking to friends or family and sharing your thoughts and fears.   Listening to music or relaxation tapes.   Tensing and then relaxing your muscles, starting at the toes and working up to the head and neck.  If you think that you would benefit from help, either in identifying the things that are causing your stress or in learning techniques to help you relax, see a caregiver who is capable of helping you with this. Rather than relying on medications, it is usually better to try and identify the things in your life that are causing stress and try to deal with them. There are many techniques of managing stress including counseling, psychotherapy, aromatherapy, yoga, and exercise. Your caregiver can help you determine what is best for you. Document Released: 01/05/2003 Document Revised: 06/27/2011 Document Reviewed: 12/02/2007 [facility] Patient Information 2012 [place], [place].  Irritable Bowel Syndrome Irritable Bowel Syndrome (IBS) is caused by a disturbance of normal bowel function. Other terms used are spastic colon, mucous colitis, and irritable colon. It does not require surgery, nor does it lead to cancer. There is no cure for IBS. But with proper diet, stress reduction, and medication, you will find that your problems (symptoms) will gradually disappear or improve. IBS is a common digestive disorder. It usually appears in late adolescence or early adulthood. Women develop it twice as often as men. CAUSES  After food has been digested and absorbed in the small intestine, waste material is moved into the colon (large intestine). In the colon, water and salts are absorbed from the undigested products coming from the small intestine. The remaining residue, or fecal material, is held for elimination. Under normal circumstances, gentle, rhythmic contractions on the bowel walls push the fecal material along the colon towards the rectum. In IBS, however, these  contractions are irregular and poorly coordinated. The fecal material is either retained too long, resulting in constipation, or expelled too soon, producing diarrhea. SYMPTOMS  The most common symptom of IBS is pain. It is typically in the lower left side of the belly (abdomen). But it may occur anywhere in the abdomen. It can be felt as heartburn, backache, or even as a dull pain in the arms or shoulders. The pain comes from excessive bowel-muscle spasms and from the buildup of gas and fecal material in the colon. This pain:  Can range from sharp belly (abdominal) cramps to a dull, continuous ache.   Usually worsens soon after eating.   Is typically relieved by having a bowel movement or passing gas.  Abdominal  pain is usually accompanied by constipation. But it may also produce diarrhea. The diarrhea typically occurs right after a meal or upon arising in the morning. The stools are typically soft and watery. They are often flecked with secretions (mucus). Other symptoms of IBS include:  Bloating.   Loss of appetite.   Heartburn.   Feeling sick to your stomach (nausea).   Belching   Vomiting   Gas.  IBS may also cause a number of symptoms that are unrelated to the digestive system:  Fatigue.   Headaches.   Anxiety   Shortness of breath   Difficulty in concentrating.   Dizziness.  These symptoms tend to come and go. DIAGNOSIS  The symptoms of IBS closely mimic the symptoms of other, more serious digestive disorders. So your caregiver may wish to perform a variety of additional tests to exclude these disorders. He/she wants to be certain of learning what is wrong (diagnosis). The nature and purpose of each test will be explained to you. TREATMENT A number of medications are available to help correct bowel function and/or relieve bowel spasms and abdominal pain. Among the drugs available are:  Mild, non-irritating laxatives for severe constipation and to help restore normal  bowel habits.   Specific anti-diarrheal medications to treat severe or prolonged diarrhea.   Anti-spasmodic agents to relieve intestinal cramps.   Your caregiver may also decide to treat you with a mild tranquilizer or sedative during unusually stressful periods in your life.  The important thing to remember is that if any drug is prescribed for you, make sure that you take it exactly as directed. Make sure that your caregiver knows how well it worked for you. HOME CARE INSTRUCTIONS   Avoid foods that are high in fat or oils. Some examples [id number]:[id number] cream, butter, frankfurters, sausage, and other fatty meats.   Avoid foods that have a laxative effect, such as fruit, fruit juice, and dairy products.   Cut out carbonated drinks, chewing gum, and "gassy" foods, such as beans and cabbage. This may help relieve bloating and belching.   Bran taken with plenty of liquids may help relieve constipation.   Keep track of what foods seem to trigger your symptoms.   Avoid emotionally charged situations or circumstances that produce anxiety.   Start or continue exercising.   Get plenty of rest and sleep.  MAKE SURE YOU:   Understand these instructions.   Will watch your condition.   Will get help right away if you are not doing well or get worse.  Document Released: 10/15/2005 Document Revised: 06/27/2011 Document Reviewed: 06/04/2008 [facility] Patient Information 2012 [place], [place].

## 2011-11-15 ENCOUNTER — Ambulatory Visit: Payer: BC Managed Care – PPO

## 2011-11-15 ENCOUNTER — Ambulatory Visit (INDEPENDENT_AMBULATORY_CARE_PROVIDER_SITE_OTHER): Payer: BC Managed Care – PPO | Admitting: Gastroenterology

## 2011-11-15 ENCOUNTER — Encounter: Payer: Self-pay | Admitting: Gastroenterology

## 2011-11-15 ENCOUNTER — Other Ambulatory Visit (INDEPENDENT_AMBULATORY_CARE_PROVIDER_SITE_OTHER): Payer: BC Managed Care – PPO

## 2011-11-15 VITALS — BP 120/68 | HR 80 | Ht 70.0 in | Wt 186.0 lb

## 2011-11-15 DIAGNOSIS — K5900 Constipation, unspecified: Secondary | ICD-10-CM

## 2011-11-15 DIAGNOSIS — R109 Unspecified abdominal pain: Secondary | ICD-10-CM

## 2011-11-15 LAB — COMPREHENSIVE METABOLIC PANEL
ALT: 15 U/L (ref 0–35)
AST: 17 U/L (ref 0–37)
Albumin: 3.8 g/dL (ref 3.5–5.2)
Alkaline Phosphatase: 72 U/L (ref 39–117)
BUN: 11 mg/dL (ref 6–23)
CO2: 26 mEq/L (ref 19–32)
Calcium: 9 mg/dL (ref 8.4–10.5)
Chloride: 103 mEq/L (ref 96–112)
Creatinine, Ser: 0.8 mg/dL (ref 0.4–1.2)
GFR: 91.11 mL/min (ref >60.00)
Glucose, Bld: 82 mg/dL (ref 70–99)
Potassium: 4.6 mEq/L (ref 3.5–5.1)
Sodium: 137 mEq/L (ref 135–145)
Total Bilirubin: 0.3 mg/dL (ref 0.3–1.2)
Total Protein: 7.9 g/dL (ref 6.0–8.3)

## 2011-11-15 LAB — CBC WITH DIFFERENTIAL/PLATELET
Basophils Absolute: 0 10*3/uL (ref 0.0–0.1)
Basophils Relative: 0.4 % (ref 0.0–3.0)
Eosinophils Absolute: 0.1 10*3/uL (ref 0.0–0.7)
Eosinophils Relative: 1 % (ref 0.0–5.0)
HCT: 37.1 % (ref 36.0–46.0)
Hemoglobin: 12.6 g/dL (ref 12.0–15.0)
Lymphocytes Relative: 38.5 % (ref 12.0–46.0)
Lymphs Abs: 2.1 10*3/uL (ref 0.7–4.0)
MCHC: 34.1 g/dL (ref 30.0–36.0)
MCV: 90.2 fl (ref 78.0–100.0)
Monocytes Absolute: 0.5 10*3/uL (ref 0.1–1.0)
Monocytes Relative: 9.2 % (ref 3.0–12.0)
Neutro Abs: 2.8 10*3/uL (ref 1.4–7.7)
Neutrophils Relative %: 50.9 % (ref 43.0–77.0)
Platelets: 344 10*3/uL (ref 150.0–400.0)
RBC: 4.12 Mil/uL (ref 3.87–5.11)
RDW: 13.1 % (ref 11.5–14.6)
WBC: 5.5 10*3/uL (ref 4.5–10.5)

## 2011-11-15 LAB — LIPASE: Lipase: 37 U/L (ref 11.0–59.0)

## 2011-11-15 MED ORDER — PEG-KCL-NACL-NASULF-NA ASC-C 100 G PO SOLR: 1.0000 | Freq: Once | ORAL | Status: DC

## 2011-11-15 MED ORDER — GLYCOPYRROLATE 2 MG PO TABS: 2.0000 mg | ORAL_TABLET | Freq: Two times a day (BID) | ORAL | Status: DC

## 2011-11-15 NOTE — Patient Instructions (Addendum)
Go directly to the basement to have your labs drawn today. You have been scheduled for a Colonoscopy with propofol. See separate instructions.  Pick up your prep kit from your pharmacy.  Use a Laxative of your choice daily to keep your bowels moving. Your prescription for glycopyrrolate has been sent to your pharmacy.   You have been scheduled for a CT scan of the abdomen and pelvis at [facility] ([address]---this is in the same building as [facility]).   You are scheduled on 11/19/11 at 10:00am. You should arrive 15 minutes prior to your appointment time for registration. Please follow the written instructions below on the day of your exam:  WARNING: IF YOU ARE ALLERGIC TO IODINE/X-RAY DYE, PLEASE NOTIFY RADIOLOGY IMMEDIATELY AT [phone]! YOU WILL BE GIVEN A 13 HOUR PREMEDICATION PREP.  1) Do not eat or drink anything after 6:00am (4 hours prior to your test) 2) You have been given 2 bottles of oral contrast to drink. The solution may taste better if refrigerated, but do NOT add ice or any other liquid to this solution. Shake well before drinking.    Drink 1 bottle of contrast @ 8:00am (2 hours prior to your exam)  Drink 1 bottle of contrast @ 9:00am (1 hour prior to your exam)  You may take any medications as prescribed with a small amount of water except for the following: Metformin, Glucophage, Glucovance, Avandamet, Riomet, Fortamet, Actoplus Met, Janumet, Glumetza or Metaglip. The above medications must be held the day of the exam AND 48 hours after the exam.  The purpose of you drinking the oral contrast is to aid in the visualization of your intestinal tract. The contrast solution may cause some diarrhea. Before your exam is started, you will be given a small amount of fluid to drink. Depending on your individual set of symptoms, you may also receive an intravenous injection of x-ray contrast/dye. Plan on being at [facility] for 30 minutes or long,  depending on the type of exam you are having performed.  If you have any questions regarding your exam or if you need to reschedule, you may call the CT department at [phone] between the hours of 8:00 am and 5:00 pm, Monday-Friday.  ________________________________________________________________________  cc: Adline Mango, FNP

## 2011-11-15 NOTE — Progress Notes (Signed)
History of Present Illness: This is a 41 year old female who relates a long history of constipation and generally has a bowel movement about every week. She is not generally a pain bloating or other symptoms associated with her constipation. Beginning on December 20 she had the onset of severe crampy lower abdominal pain associated with her menstrual period. Since her period and that she has had persistent problems with recurrent crampy lower abdominal pain worsening and constipation. Her symptoms are now exacerbated by meals. She tried MiraLax and magnesium citrate without adequate results. She has generally used stimulant laxative pills in the past. Denies weight loss, diarrhea, change in stool caliber, melena, hematochezia, nausea, vomiting, dysphagia, reflux symptoms, chest pain.  Review of Systems: Pertinent positive and negative review of systems were noted in the above HPI section. All other review of systems were otherwise negative.  Current Medications, Allergies, Past Medical History, Past Surgical History, Family History and Social History were reviewed in [employer] record.  Physical Exam: General: Well developed , well nourished, no acute distress Head: Normocephalic and atraumatic Eyes:  sclerae anicteric, EOMI Ears: Normal auditory acuity Mouth: No deformity or lesions Neck: Supple, no masses or thyromegaly Lungs: Clear throughout to auscultation Heart: Regular rate and rhythm; no murmurs, rubs or bruits Abdomen: Soft, non tender and non distended. No masses, hepatosplenomegaly or hernias noted. Normal Bowel sounds Rectal: No lesions, or brown stool pellets in the vault, Hemoccult negative Musculoskeletal: Symmetrical with no gross deformities  Skin: No lesions on visible extremities Pulses:  Normal pulses noted Extremities: No clubbing, cyanosis, edema or deformities noted Neurological: Alert oriented x 4, grossly nonfocal Cervical Nodes:  No significant  cervical adenopathy Inguinal Nodes: No significant inguinal adenopathy Psychological:  Alert and cooperative. Normal mood and affect  Assessment and Recommendations:  1. Lower abdominal pain and constipation. Unclear if this is a gastrointestinal process or GYN process since her symptoms began acutely during her menstrual period. She is advised to have an evaluation with her gynecologist. Rule out pain associated with constipation, inflammatory bowel disease and GYN disorders. Obtain blood work including a pregnancy test, schedule abdominal pelvic CT and schedule colonoscopy. She is advised to increase the use of laxative of her choice and begin glycopyrrolate. The risks, benefits, and alternatives to colonoscopy with possible biopsy and possible polypectomy were discussed with the patient and they consent to proceed.

## 2011-11-16 LAB — HCG, SERUM, QUALITATIVE: Preg, Serum: NEGATIVE

## 2011-11-19 ENCOUNTER — Ambulatory Visit (INDEPENDENT_AMBULATORY_CARE_PROVIDER_SITE_OTHER)
Admission: RE | Admit: 2011-11-19 | Discharge: 2011-11-19 | Disposition: A | Discharge status: Home / Self Care | Payer: BC Managed Care – PPO | Source: Ambulatory Visit | Attending: Gastroenterology | Admitting: Gastroenterology

## 2011-11-19 DIAGNOSIS — K5900 Constipation, unspecified: Secondary | ICD-10-CM

## 2011-11-19 DIAGNOSIS — R109 Unspecified abdominal pain: Secondary | ICD-10-CM

## 2011-11-19 MED ORDER — IOHEXOL 300 MG/ML  SOLN
100.0000 mL | Freq: Once | INTRAMUSCULAR | Status: AC | PRN
  Administered 2011-11-19: 100 mL via INTRAVENOUS

## 2011-11-20 ENCOUNTER — Ambulatory Visit: Payer: BC Managed Care – PPO | Admitting: Family

## 2011-11-20 DIAGNOSIS — Z0289 Encounter for other administrative examinations: Secondary | ICD-10-CM

## 2011-11-21 ENCOUNTER — Telehealth: Payer: Self-pay | Admitting: Gastroenterology

## 2011-11-21 NOTE — Telephone Encounter (Signed)
Spoke with pt and she is aware.

## 2011-11-21 NOTE — Telephone Encounter (Signed)
Spoke with pt and she is aware of CT results per Dr. Russella Dar and she knows to follow-up with her GYN.   Pt wants to know if she still needs to keep her appt 12/03/11 for the colonoscopy......Marland KitchenPlease advise.

## 2011-11-21 NOTE — Telephone Encounter (Signed)
Yes keep GYN and colonoscopy appointments.

## 2011-11-29 ENCOUNTER — Telehealth: Payer: Self-pay | Admitting: Gastroenterology

## 2011-11-29 NOTE — Telephone Encounter (Signed)
No charge this time. 

## 2011-12-03 ENCOUNTER — Encounter: Payer: BC Managed Care – PPO | Admitting: Gastroenterology

## 2013-06-10 ENCOUNTER — Ambulatory Visit (INDEPENDENT_AMBULATORY_CARE_PROVIDER_SITE_OTHER): Payer: BC Managed Care – PPO | Admitting: Family Medicine

## 2013-06-10 VITALS — BP 100/60 | HR 99 | Temp 97.9°F | Resp 16 | Wt 187.0 lb

## 2013-06-10 DIAGNOSIS — R112 Nausea with vomiting, unspecified: Secondary | ICD-10-CM

## 2013-06-10 DIAGNOSIS — R51 Headache: Secondary | ICD-10-CM

## 2013-06-10 DIAGNOSIS — E860 Dehydration: Secondary | ICD-10-CM

## 2013-06-10 MED ORDER — ONDANSETRON HCL 4 MG PO TABS: ORAL_TABLET | ORAL | Status: DC

## 2013-06-10 MED ORDER — ONDANSETRON 4 MG PO TBDP
8.0000 mg | ORAL_TABLET | Freq: Once | ORAL | Status: AC
  Administered 2013-06-10: 8 mg via ORAL

## 2013-06-10 MED ORDER — SUMATRIPTAN SUCCINATE 50 MG PO TABS: 50.0000 mg | ORAL_TABLET | ORAL | Status: DC | PRN

## 2013-06-10 MED ORDER — KETOROLAC TROMETHAMINE 30 MG/ML IJ SOLN
30.0000 mg | Freq: Once | INTRAMUSCULAR | Status: AC
  Administered 2013-06-10: 30 mg via INTRAMUSCULAR

## 2013-06-10 NOTE — Progress Notes (Signed)
  Subjective:    Patient ID: Tamara Moore, female    DOB: 1971-03-19, 42 y.o.   MRN: [id number]  HPI Root canal yesterday. Prescribed amoxicillin to take after the procedure at 6pm yesterday. Has been on amox previously without difficulty. Started to feel sick at 9am this morning. Woke up and ate some breakfast and started vomiting. Since then been persistantly vomiting, at least a dozen times. Also took one hydrocodone last night, has taken previously without problem. Cannot even keep water down without vomiting. Bad headache, weakness. No dizziness or lightheadedness. Feels woozy. No known sick contacts. Has also had some diarrhea. No rash, trouble breathing, mouth swelling. No fever. No hematemesis, hematochezia. Not taking any meds. No recent travel, no tick bites. Urinated only once today before noon. Does also have a h/o migraines and this feels similar to previous migraines.    Review of Systems  Constitutional: Positive for fatigue. Negative for fever and chills.  HENT: Positive for neck pain. Negative for neck stiffness.   Eyes: Negative.   Respiratory: Negative.   Cardiovascular: Negative.   Neurological: Positive for weakness. Negative for dizziness and light-headedness.  All other systems reviewed and are negative.       Objective:   Physical Exam  Constitutional: She is oriented to person, place, and time. She appears well-developed and well-nourished. She appears distressed.  HENT:  Head: Normocephalic and atraumatic.  Right Ear: External ear normal.  Left Ear: External ear normal.  Mouth/Throat: No oropharyngeal exudate.  Eyes: Conjunctivae and EOM are normal. Pupils are equal, round, and reactive to light. No scleral icterus.  Neck: Normal range of motion.  Cardiovascular: Normal rate, regular rhythm, normal heart sounds and intact distal pulses.  Exam reveals no gallop.   No murmur heard. Pulmonary/Chest: Effort normal and breath sounds normal. No respiratory  distress. She has no wheezes.  Abdominal: Soft. She exhibits no distension. There is no tenderness. There is no rebound and no guarding.  Neurological: She is alert and oriented to person, place, and time.  Skin: Skin is warm and dry.  Psychiatric: She has a normal mood and affect.  Tacky mucous membranes. Sluggish cap refill     Assessment & Plan:  #1. Acute migrainous headache and vomiting after dental procedure At this point, I strongly doubt reaction to amoxicillin. Hydrocodone is possible etiology of vomiting, but less likely since this has continued for more than 24 hrs after her last dose. Given N/V/D, concern for viral etiology. Also possible is severe migraine with prominent vomiting. Given that patient is persistently vomiting, unable to tolerate PO, and appears clinically dehydrated with low BP will give 1 L bolus NS. Also zofran ODT 8 mg. If no improvement in headache after above treatments, consider toradol for headache. Reeval after fluid bolus.  Recheck: Patient initially minimal improvement. Ordered toradol 30 mg and 2nd fluid bolus.   Recheck: Patient feeling better now. Starting to perk up with improvement in nausea and headache.

## 2013-06-10 NOTE — Patient Instructions (Addendum)
Thank you for coming in today  I suspect that your symptoms are either from a virus or migraine  We gave you fluids and zofran here in the office as well as toradol for your headache  I recommend that you talk with your PCP about preventative medications - Supplements: Butterbur (Petadolex brand), Riboflavin 400 mg daily, Magnesium 400-800 mg daily - Meds: Topamax, propranolol, amitryptiline.  Followup with PCP for migraines. Migraine Headache A migraine headache is an intense, throbbing pain on one or both sides of your head. A migraine can last for 30 minutes to several hours. CAUSES  The exact cause of a migraine headache is not always known. However, a migraine may be caused when nerves in the brain become irritated and release chemicals that cause inflammation. This causes pain. SYMPTOMS  Pain on one or both sides of your head.  Pulsating or throbbing pain.  Severe pain that prevents daily activities.  Pain that is aggravated by any physical activity.  Nausea, vomiting, or both.  Dizziness.  Pain with exposure to bright lights, loud noises, or activity.  General sensitivity to bright lights, loud noises, or smells. Before you get a migraine, you may get warning signs that a migraine is coming (aura). An aura may include:  Seeing flashing lights.  Seeing bright spots, halos, or zig-zag lines.  Having tunnel vision or blurred vision.  Having feelings of numbness or tingling.  Having trouble talking.  Having muscle weakness. MIGRAINE TRIGGERS  Alcohol.  Smoking.  Stress.  Menstruation.  Aged cheeses.  Foods or drinks that contain nitrates, glutamate, aspartame, or tyramine.  Lack of sleep.  Chocolate.  Caffeine.  Hunger.  Physical exertion.  Fatigue.  Medicines used to treat chest pain (nitroglycerine), birth control pills, estrogen, and some blood pressure medicines. DIAGNOSIS  A migraine headache is often diagnosed based  on:  Symptoms.  Physical examination.  A CT scan or MRI of your head. TREATMENT Medicines may be given for pain and nausea. Medicines can also be given to help prevent recurrent migraines.  HOME CARE INSTRUCTIONS  Only take over-the-counter or prescription medicines for pain or discomfort as directed by your caregiver. The use of long-term narcotics is not recommended.  Lie down in a dark, quiet room when you have a migraine.  Keep a journal to find out what may trigger your migraine headaches. For example, write down:  What you eat and drink.  How much sleep you get.  Any change to your diet or medicines.  Limit alcohol consumption.  Quit smoking if you smoke.  Get 7 to 9 hours of sleep, or as recommended by your caregiver.  Limit stress.  Keep lights dim if bright lights bother you and make your migraines worse. SEEK IMMEDIATE MEDICAL CARE IF:   Your migraine becomes severe.  You have a fever.  You have a stiff neck.  You have vision loss.  You have muscular weakness or loss of muscle control.  You start losing your balance or have trouble walking.  You feel faint or pass out.  You have severe symptoms that are different from your first symptoms. MAKE SURE YOU:   Understand these instructions.  Will watch your condition.  Will get help right away if you are not doing well or get worse. Document Released: 10/15/2005 Document Revised: 01/07/2012 Document Reviewed: 10/05/2011 [facility] Patient Information 2014 [place], [place].   Nausea and Vomiting Nausea is a sick feeling that often comes before throwing up (vomiting). Vomiting is a reflex where  stomach contents come out of your mouth. Vomiting can cause severe loss of body fluids (dehydration). Children and elderly adults can become dehydrated quickly, especially if they also have diarrhea. Nausea and vomiting are symptoms of a condition or disease. It is important to find the cause of your  symptoms. CAUSES   Direct irritation of the stomach lining. This irritation can result from increased acid production (gastroesophageal reflux disease), infection, food poisoning, taking certain medicines (such as nonsteroidal anti-inflammatory drugs), alcohol use, or tobacco use.  Signals from the brain.These signals could be caused by a headache, heat exposure, an inner ear disturbance, increased pressure in the brain from injury, infection, a tumor, or a concussion, pain, emotional stimulus, or metabolic problems.  An obstruction in the gastrointestinal tract (bowel obstruction).  Illnesses such as diabetes, hepatitis, gallbladder problems, appendicitis, kidney problems, cancer, sepsis, atypical symptoms of a heart attack, or eating disorders.  Medical treatments such as chemotherapy and radiation.  Receiving medicine that makes you sleep (general anesthetic) during surgery. DIAGNOSIS Your caregiver may ask for tests to be done if the problems do not improve after a few days. Tests may also be done if symptoms are severe or if the reason for the nausea and vomiting is not clear. Tests may include:  Urine tests.  Blood tests.  Stool tests.  Cultures (to look for evidence of infection).  X-rays or other imaging studies. Test results can help your caregiver make decisions about treatment or the need for additional tests. TREATMENT You need to stay well hydrated. Drink frequently but in small amounts.You may wish to drink water, sports drinks, clear broth, or eat frozen ice pops or gelatin dessert to help stay hydrated.When you eat, eating slowly may help prevent nausea.There are also some antinausea medicines that may help prevent nausea. HOME CARE INSTRUCTIONS   Take all medicine as directed by your caregiver.  If you do not have an appetite, do not force yourself to eat. However, you must continue to drink fluids.  If you have an appetite, eat a normal diet unless your  caregiver tells you differently.  Eat a variety of complex carbohydrates (rice, wheat, potatoes, bread), lean meats, yogurt, fruits, and vegetables.  Avoid high-fat foods because they are more difficult to digest.  Drink enough water and fluids to keep your urine clear or pale yellow.  If you are dehydrated, ask your caregiver for specific rehydration instructions. Signs of dehydration may include:  Severe thirst.  Dry lips and mouth.  Dizziness.  Dark urine.  Decreasing urine frequency and amount.  Confusion.  Rapid breathing or pulse. SEEK IMMEDIATE MEDICAL CARE IF:   You have blood or brown flecks (like coffee grounds) in your vomit.  You have black or bloody stools.  You have a severe headache or stiff neck.  You are confused.  You have severe abdominal pain.  You have chest pain or trouble breathing.  You do not urinate at least once every 8 hours.  You develop cold or clammy skin.  You continue to vomit for longer than 24 to 48 hours.  You have a fever. MAKE SURE YOU:   Understand these instructions.  Will watch your condition.  Will get help right away if you are not doing well or get worse. Document Released: 10/15/2005 Document Revised: 01/07/2012 Document Reviewed: 03/14/2011 [facility] Patient Information 2014 [place], [place].

## 2013-06-11 NOTE — Progress Notes (Signed)
Patient discussed with Dr. Neomia Dear. Agree with assessment and plan of care per her note. Suspected migraine vs. Med intolerance, but improved after IVF as below.

## 2013-11-11 ENCOUNTER — Telehealth: Payer: Self-pay

## 2013-11-11 NOTE — Telephone Encounter (Signed)
Pt is needing to talk with dr Maryln Gottron about refilling the migraine meds that she was given samples of   [facility] [address]   Best number [phone]

## 2013-11-16 NOTE — Telephone Encounter (Signed)
I am unsure what samples patient is talking about - we gave her Zofran in the office which is for nausea and Toradol.  I think that it would be best for patient to RTC for Headache eval when she is not having headaches so we can give her a good medication.

## 2013-11-16 NOTE — Telephone Encounter (Signed)
LMOM to CB. 

## 2013-11-18 NOTE — Telephone Encounter (Signed)
Spoke to husband- advised pt has a refill on the imitrex at the pharmacy.  He will have Anneka call back to schedule an appt with Dr. Lorelei Pont or Dr. Tamala Julian.

## 2013-11-24 ENCOUNTER — Telehealth: Payer: Self-pay

## 2013-11-24 NOTE — Telephone Encounter (Addendum)
Patient says that the Imitrex rx was never called into the pharmacy [phone] [facility] [address]

## 2013-11-25 MED ORDER — SUMATRIPTAN SUCCINATE 50 MG PO TABS: 50.0000 mg | ORAL_TABLET | ORAL | Status: DC | PRN

## 2013-11-25 NOTE — Telephone Encounter (Signed)
Spoke with pharmacist and she states the Imitrex needs to be refilled but only a quantity of 9. Pts husband states she will come in to recheck. Can they she get a refill on this? Please advise.

## 2013-11-25 NOTE — Telephone Encounter (Signed)
Medication refilled.  Please notify patient.

## 2013-11-26 NOTE — Telephone Encounter (Signed)
Spoke with husband, he will let pt know Rx ready.

## 2015-06-05 ENCOUNTER — Ambulatory Visit (INDEPENDENT_AMBULATORY_CARE_PROVIDER_SITE_OTHER): Payer: 59 | Admitting: Emergency Medicine

## 2015-06-05 VITALS — BP 134/100 | HR 113 | Temp 98.3°F | Resp 20 | Ht 69.5 in | Wt 183.5 lb

## 2015-06-05 DIAGNOSIS — K921 Melena: Secondary | ICD-10-CM | POA: Diagnosis not present

## 2015-06-05 LAB — POCT CBC
Granulocyte percent: 70.7 %G (ref 37–80)
HCT, POC: 45.5 % (ref 37.7–47.9)
Hemoglobin: 14.8 g/dL (ref 12.2–16.2)
Lymph, poc: 1.8 (ref 0.6–3.4)
MCH, POC: 29.7 pg (ref 27–31.2)
MCHC: 32.5 g/dL (ref 31.8–35.4)
MCV: 91.3 fL (ref 80–97)
MID (cbc): 0.2 (ref 0–0.9)
MPV: 6.2 fL (ref 0–99.8)
POC Granulocyte: 4.9 (ref 2–6.9)
POC LYMPH PERCENT: 26.4 %L (ref 10–50)
POC MID %: 2.9 %M (ref 0–12)
Platelet Count, POC: 326 10*3/uL (ref 142–424)
RBC: 4.98 M/uL (ref 4.04–5.48)
RDW, POC: 11.9 %
WBC: 7 10*3/uL (ref 4.6–10.2)

## 2015-06-05 LAB — IFOBT (OCCULT BLOOD): IFOBT: POSITIVE

## 2015-06-05 NOTE — Patient Instructions (Signed)
Rectal Bleeding °Rectal bleeding is when blood passes out of the anus. It is usually a sign that something is wrong. It may not be serious, but it should always be evaluated. Rectal bleeding may present as bright red blood or extremely dark stools. The color may range from dark red or maroon to black (like tar). It is important that the cause of rectal bleeding be identified so treatment can be started and the problem corrected. °CAUSES  °· Hemorrhoids. These are enlarged (dilated) blood vessels or veins in the anal or rectal area. °· Fistulas. These are abnormal, burrowing channels that usually run from inside the rectum to the skin around the anus. They can bleed. °· Anal fissures. This is a tear in the tissue of the anus. Bleeding occurs with bowel movements. °· Diverticulosis. This is a condition in which pockets or sacs project from the bowel wall. Occasionally, the sacs can bleed. °· Diverticulitis. This is an infection involving diverticulosis of the colon. °· Proctitis and colitis. These are conditions in which the rectum, colon, or both, can become inflamed and pitted (ulcerated). °· Polyps and cancer. Polyps are non-cancerous (benign) growths in the colon that may bleed. Certain types of polyps turn into cancer. °· Protrusion of the rectum. Part of the rectum can project from the anus and bleed. °· Certain medicines. °· Intestinal infections. °· Blood vessel abnormalities. °HOME CARE INSTRUCTIONS °· Eat a high-fiber diet to keep your stool soft. °· Limit activity. °· Drink enough fluids to keep your urine clear or pale yellow. °· Warm baths may be useful to soothe rectal pain. °· Follow up with your caregiver as directed. °SEEK IMMEDIATE MEDICAL CARE IF: °· You develop increased bleeding. °· You have black or dark red stools. °· You vomit blood or material that looks like coffee grounds. °· You have abdominal pain or tenderness. °· You have a fever. °· You feel weak, nauseous, or you faint. °· You have  severe rectal pain or you are unable to have a bowel movement. °MAKE SURE YOU: °· Understand these instructions. °· Will watch your condition. °· Will get help right away if you are not doing well or get worse. °Document Released: 04/06/2002 Document Revised: 01/07/2012 Document Reviewed: 04/01/2011 °[employer]® Patient Information ©2015 [employer]. This information is not intended to replace advice given to you by your health care provider. Make sure you discuss any questions you have with your health care provider. ° °

## 2015-06-05 NOTE — Progress Notes (Signed)
Subjective:  Patient ID: Tamara Moore, female    DOB: September 22, 1971  Age: 44 y.o. MRN: [id number]  CC: Abdominal Pain and Depression   HPI Tamara Moore presents  blood in her stool. She returned from [place] over a week ago. Has no anal rectal problems. This morning she noticed that she had intense abdominal cramping associated with blood in her stool. She's had multiple bloody stools. She has no history of irritable bowel ulcerative colitis or Crohn's. In her family. She has no fever chills. Has not been on antibiotic's lately. There is no Tamara Moore the family is sick.  History Tamara Moore has a past medical history of Anxiety; Chronic headaches; Depression; Thyroid disease; Irritable bowel syndrome (IBS); History of kidney stones; and Hemorrhoids.   She has past surgical history that includes Cesarean section; Eye surgery (1979); and Knee Arthroplasty.   Her  family history includes Breast cancer in her maternal grandmother; Diabetes in her maternal aunt; Hyperlipidemia in her father and mother; Hypertension in her father and mother; Irritable bowel syndrome in her mother; Stroke in her mother. There is no history of Colon cancer.  She   reports that she has never smoked. She has never used smokeless tobacco. She reports that she does not drink alcohol or use illicit drugs.  Outpatient Prescriptions Prior to Visit  Medication Sig Dispense Refill  . SUMAtriptan (IMITREX) 50 MG tablet Take 1 tablet (50 mg total) by mouth every 2 (two) hours as needed for migraine. 9 tablet 1  . amitriptyline (ELAVIL) 10 MG tablet Take 1 tablet (10 mg total) by mouth at bedtime. 30 tablet 2  . amoxicillin (AMOXIL) 500 MG tablet Take 500 mg by mouth 2 (two) times daily.    Marland Kitchen glycopyrrolate (ROBINUL) 2 MG tablet Take 1 tablet (2 mg total) by mouth 2 (two) times daily. 60 tablet 11  . HYDROcodone-acetaminophen (NORCO/VICODIN) 5-325 MG per tablet Take 1 tablet by mouth every 6 (six) hours as needed  for pain.    Marland Kitchen ondansetron (ZOFRAN) 4 MG tablet Take 1 tablet PO q4-6 hr prn nausea and vomiting. (Patient not taking: Reported on 06/05/2015) 20 tablet 0  . peg 3350 powder (MOVIPREP) 100 G SOLR Take 1 kit (100 g total) by mouth once. (Patient not taking: Reported on 06/05/2015) 1 kit 0   No facility-administered medications prior to visit.    History   Social History  . Marital Status: Divorced    Spouse Name: Tamara Moore  . Number of Children: 1  . Years of Education: Tamara Moore   Occupational History  . Homemaker/ Own small business    Social History Main Topics  . Smoking status: Never Smoker   . Smokeless tobacco: Never Used  . Alcohol Use: No  . Drug Use: No  . Sexual Activity: Not on file   Other Topics Concern  . None   Social History Narrative     Review of Systems  Constitutional: Negative for fever, chills and appetite change.  HENT: Negative for congestion, ear pain, postnasal drip, sinus pressure and sore throat.   Eyes: Negative for pain and redness.  Respiratory: Negative for cough, shortness of breath and wheezing.   Cardiovascular: Negative for leg swelling.  Gastrointestinal: Negative for nausea, vomiting, abdominal pain, diarrhea, constipation and blood in stool.  Endocrine: Negative for polyuria.  Genitourinary: Negative for dysuria, urgency, frequency and flank pain.  Musculoskeletal: Negative for gait problem.  Skin: Negative for rash.  Neurological: Negative for weakness and headaches.  Psychiatric/Behavioral: Negative for  confusion and decreased concentration. The patient is not nervous/anxious.     Objective:  BP 134/100 mmHg  Pulse 113  Temp(Src) 98.3 F (36.8 C) (Oral)  Resp 20  Ht 5' 9.5" (1.765 m)  Wt 183 lb 8 oz (83.235 kg)  BMI 26.72 kg/m2  SpO2 98%  LMP 10/18/2011  Physical Exam  Constitutional: She is oriented to person, place, and time. She appears well-developed and well-nourished. No distress.  HENT:  Head: Normocephalic and atraumatic.    Right Ear: External ear normal.  Left Ear: External ear normal.  Nose: Nose normal.  Eyes: Conjunctivae and EOM are normal. Pupils are equal, round, and reactive to light. No scleral icterus.  Neck: Normal range of motion. Neck supple. No tracheal deviation present.  Cardiovascular: Normal rate, regular rhythm and normal heart sounds.   Pulmonary/Chest: Effort normal. No respiratory distress. She has no wheezes. She has no rales.  Abdominal: She exhibits no mass. There is no tenderness. There is no rebound and no guarding.  Musculoskeletal: She exhibits no edema.  Lymphadenopathy:    She has no cervical adenopathy.  Neurological: She is alert and oriented to person, place, and time. Coordination normal.  Skin: Skin is warm and dry. No rash noted.  Psychiatric: She has a normal mood and affect. Her behavior is normal.      Assessment & Plan:   Tamara Moore was seen today for abdominal pain and depression.  Diagnoses and all orders for this visit:  Hematochezia Orders: -     POCT CBC -     IFOBT POC (occult bld, rslt in office) -     Ambulatory referral to Gastroenterology  I am having Tamara Moore maintain her amitriptyline, peg 3350 powder, glycopyrrolate, amoxicillin, HYDROcodone-acetaminophen, ondansetron, and SUMAtriptan.  No orders of the defined types were placed in this encounter.    Appropriate red flag conditions were discussed with the patient as well as actions that should be taken.  Patient expressed his understanding.  Follow-up: Return if symptoms worsen or fail to improve.  Roselee Culver, MD    Results for orders placed or performed in visit on 06/05/15  POCT CBC  Result Value Ref Range   WBC 7.0 4.6 - 10.2 K/uL   Lymph, poc 1.8 0.6 - 3.4   POC LYMPH PERCENT 26.4 10 - 50 %L   MID (cbc) 0.2 0 - 0.9   POC MID % 2.9 0 - 12 %M   POC Granulocyte 4.9 2 - 6.9   Granulocyte percent 70.7 37 - 80 %G   RBC 4.98 4.04 - 5.48 M/uL   Hemoglobin 14.8 12.2 -  16.2 g/dL   HCT, POC 45.5 37.7 - 47.9 %   MCV 91.3 80 - 97 fL   MCH, POC 29.7 27 - 31.2 pg   MCHC 32.5 31.8 - 35.4 g/dL   RDW, POC 11.9 %   Platelet Count, POC 326 142 - 424 K/uL   MPV 6.2 0 - 99.8 fL  IFOBT POC (occult bld, rslt in office)  Result Value Ref Range   IFOBT Positive

## 2015-06-17 ENCOUNTER — Ambulatory Visit (INDEPENDENT_AMBULATORY_CARE_PROVIDER_SITE_OTHER): Payer: 59 | Admitting: Nurse Practitioner

## 2015-06-17 ENCOUNTER — Encounter: Payer: Self-pay | Admitting: Nurse Practitioner

## 2015-06-17 VITALS — BP 118/80 | HR 84 | Ht 69.75 in | Wt 187.1 lb

## 2015-06-17 DIAGNOSIS — K5909 Other constipation: Secondary | ICD-10-CM | POA: Diagnosis not present

## 2015-06-17 DIAGNOSIS — K921 Melena: Secondary | ICD-10-CM

## 2015-06-17 MED ORDER — NA SULFATE-K SULFATE-MG SULF 17.5-3.13-1.6 GM/177ML PO SOLN: 1.0000 | Freq: Once | ORAL | Status: DC

## 2015-06-17 NOTE — Patient Instructions (Signed)
You have been scheduled for a colonoscopy. Please follow written instructions given to you at your visit today.  Please pick up your prep supplies at the pharmacy within the next 1-3 days. If you use inhalers (even only as needed), please bring them with you on the day of your procedure.   

## 2015-06-17 NOTE — Progress Notes (Signed)
HPI :  Patient is a 44 year old female known to Dr. Fuller Plan. She was seen Jan 2013 for evaluation of constipation and lower abdominal pain. Colonoscopy was recommended but patient had to cancel due to unavailability of a driver. She is referred by PCP for evaluation of hematochezia. Two weeks ago patient developed acute abdominal cramps followed by loose stool then hematochezia. Bleeding lasted for about 12 hours from late Saturday night to early Sunday morning. Patient went to urgent care, her hemoglobin was normal at 14.8. White count normal at 7.  No bleeding since that isolated episode but the following week (last Sunday) patient had diarrhea. She hasn't had a BM since which isn't unusual for patient as she struggles with chronic constipation. MiraLAX causes her to feel full. Patient doubts she gets enough fiber or water   Past Medical History  Diagnosis Date  . Anxiety   . Chronic headaches   . Depression   . Thyroid disease   . Irritable bowel syndrome (IBS)   . History of kidney stones   . Hemorrhoids   . Migraine     Family History  Problem Relation Age of Onset  . Stroke Mother   . Hypertension Mother   . Hyperlipidemia Mother   . Hypertension Father   . Hyperlipidemia Father   . Colon cancer Neg Hx   . Breast cancer Maternal Grandmother   . Diabetes Maternal Aunt   . Irritable bowel syndrome Mother   . Ulcers Maternal Grandmother    Social History  Substance Use Topics  . Smoking status: Never Smoker   . Smokeless tobacco: Never Used  . Alcohol Use: No   Current Outpatient Prescriptions  Medication Sig Dispense Refill  . chlorhexidine (PERIDEX) 0.12 % solution RINSE WITH 15MLS FOR 30 SECONDS TWICE A DAY. DO NOT EAT OR DRINK 30 MINS AFTER USE. **NEW INS CARD**  2  . SUMAtriptan (IMITREX) 50 MG tablet Take 1 tablet (50 mg total) by mouth every 2 (two) hours as needed for migraine. 9 tablet 1   No current facility-administered medications for this visit.    Allergies  Allergen Reactions  . Aspirin     Baby aspirin only     Review of Systems: All systems reviewed and negative except where noted in HPI.    Physical Exam: BP 118/80 mmHg  Pulse 84  Ht 5' 9.75" (1.772 m)  Wt 187 lb 2 oz (84.879 kg)  BMI 27.03 kg/m2  LMP 10/18/2011 Constitutional: Pleasant,well-developed, white female in no acute distress. HEENT: Normocephalic and atraumatic. Conjunctivae are normal. No scleral icterus. Neck supple. Fullness of anterior neck but no appreciable thyromegaly. Cardiovascular: Normal rate, regular rhythm.  Pulmonary/chest: Effort normal and breath sounds normal. No wheezing, rales or rhonchi. Abdominal: Soft, nondistended, nontender. Bowel sounds active throughout. There are no masses palpable. No hepatomegaly. Extremities: no edema Lymphadenopathy: No cervical adenopathy noted. Neurological: Alert and oriented to person place and time. Skin: Skin is warm and dry. No rashes noted. Psychiatric: Normal mood and affect. Behavior is normal.   ASSESSMENT AND PLAN:  1. Pleasant 44 year old female with isolated episode of lower abdominal pain folllowed by diarrhea then 12 hours of hematochezia. Sounds like ischemic colitis, infectious etiology not excluded. No bleeding since this episode 2 weeks ago. We discussed risk factors for ischemic colitis ( meds such as Imitrex, dehydration, etc...). WBC and hgb normal. For further evaluation patient will be scheduled for a colonoscopy. The risks, benefits, and alternatives to colonoscopy with possible  biopsy and possible polypectomy were discussed with the patient and she consents to proceed. Patient will need a two day bowel prep  2. Chronic constipation. She will increase dietary fiber and daily water intake. Miralax causes excessive fullness  3. History of thyroid polyps, benign per patient  Cc: Ellison Carwin, MD

## 2015-06-19 ENCOUNTER — Encounter: Payer: Self-pay | Admitting: Nurse Practitioner

## 2015-06-20 NOTE — Progress Notes (Signed)
Reviewed and agree with management plan.  Arienne Gartin T. Rashidah [place], MD FACG 

## 2015-08-26 ENCOUNTER — Encounter: Payer: Self-pay | Admitting: Gastroenterology

## 2015-08-26 ENCOUNTER — Ambulatory Visit (AMBULATORY_SURGERY_CENTER): Payer: 59 | Admitting: Gastroenterology

## 2015-08-26 VITALS — BP 166/101 | HR 78 | Temp 98.5°F | Resp 87 | Ht 69.75 in | Wt 187.0 lb

## 2015-08-26 DIAGNOSIS — K621 Rectal polyp: Secondary | ICD-10-CM

## 2015-08-26 DIAGNOSIS — K921 Melena: Secondary | ICD-10-CM

## 2015-08-26 DIAGNOSIS — D124 Benign neoplasm of descending colon: Secondary | ICD-10-CM | POA: Diagnosis not present

## 2015-08-26 MED ORDER — SODIUM CHLORIDE 0.9 % IV SOLN: 500.0000 mL | INTRAVENOUS | Status: DC

## 2015-08-26 NOTE — Progress Notes (Signed)
To recovery, report to [place], Rn, VSS.

## 2015-08-26 NOTE — Progress Notes (Signed)
Called to room to assist during endoscopic procedure.  Patient ID and intended procedure confirmed with present staff. Received instructions for my participation in the procedure from the performing physician.  

## 2015-08-26 NOTE — Patient Instructions (Signed)
1 polyp removed and sent to pathology.  Await pathology results for final recommendations.    YOU HAD AN ENDOSCOPIC PROCEDURE TODAY AT [facility]:   Refer to the procedure report that was given to you for any specific questions about what was found during the examination.  If the procedure report does not answer your questions, please call your gastroenterologist to clarify.  If you requested that your care partner not be given the details of your procedure findings, then the procedure report has been included in a sealed envelope for you to review at your convenience later.  YOU SHOULD EXPECT: Some feelings of bloating in the abdomen. Passage of more gas than usual.  Walking can help get rid of the air that was put into your GI tract during the procedure and reduce the bloating. If you had a lower endoscopy (such as a colonoscopy or flexible sigmoidoscopy) you may notice spotting of blood in your stool or on the toilet paper. If you underwent a bowel prep for your procedure, you may not have a normal bowel movement for a few days.  Please Note:  You might notice some irritation and congestion in your nose or some drainage.  This is from the oxygen used during your procedure.  There is no need for concern and it should clear up in a day or so.  SYMPTOMS TO REPORT IMMEDIATELY:   Following lower endoscopy (colonoscopy or flexible sigmoidoscopy):  Excessive amounts of blood in the stool  Significant tenderness or worsening of abdominal pains  Swelling of the abdomen that is new, acute  Fever of 100F or higher     For urgent or emergent issues, a gastroenterologist can be reached at any hour by calling [phone].   DIET: Your first meal following the procedure should be a small meal and then it is ok to progress to your normal diet. Heavy or fried foods are harder to digest and may make you feel nauseous or bloated.  Likewise, meals heavy in dairy and vegetables can increase  bloating.  Drink plenty of fluids but you should avoid alcoholic beverages for 24 hours.  ACTIVITY:  You should plan to take it easy for the rest of today and you should NOT DRIVE or use heavy machinery until tomorrow (because of the sedation medicines used during the test).    FOLLOW UP: Our staff will call the number listed on your records the next business day following your procedure to check on you and address any questions or concerns that you may have regarding the information given to you following your procedure. If we do not reach you, we will leave a message.  However, if you are feeling well and you are not experiencing any problems, there is no need to return our call.  We will assume that you have returned to your regular daily activities without incident.  If any biopsies were taken you will be contacted by phone or by letter within the next 1-3 weeks.  Please call us at [phone] if you have not heard about the biopsies in 3 weeks.    SIGNATURES/CONFIDENTIALITY: You and/or your care partner have signed paperwork which will be entered into your electronic medical record.  These signatures attest to the fact that that the information above on your After Visit Summary has been reviewed and is understood.  Full responsibility of the confidentiality of this discharge information lies with you and/or your care-partner.

## 2015-08-26 NOTE — Op Note (Signed)
[place]  [address] [place], [id number]   COLONOSCOPY PROCEDURE REPORT  PATIENT: Tamara Moore, Tamara Moore  MR#: [id number] BIRTHDATE: 02/15/1971 , 64  yrs. old GENDER: female ENDOSCOPIST: Ladene Artist, MD, [facility] REFERRED BY: Annye Asa, MD PROCEDURE DATE:  08/26/2015 PROCEDURE:   Colonoscopy, diagnostic and Colonoscopy with snare polypectomy First Screening Colonoscopy - Avg.  risk and is 50 yrs.  old or older - No.  Prior Negative Screening - Now for repeat screening. N/A  History of Adenoma - Now for follow-up colonoscopy & has been > or = to 3 yrs.  N/A  Polyps removed today? Yes ASA CLASS:   Class II INDICATIONS:Evaluation of unexplained GI bleeding, Colorectal Neoplasm Risk Assessment for this procedure is average risk, and hematochezia. MEDICATIONS: Monitored anesthesia care and Propofol 430 mg IV DESCRIPTION OF PROCEDURE:   After the risks benefits and alternatives of the procedure were thoroughly explained, informed consent was obtained.  The digital rectal exam revealed no abnormalities of the rectum.   The LB [id number]  endoscope was introduced through the anus and advanced to the cecum, which was identified by both the appendix and ileocecal valve. No adverse events experienced.   The quality of the prep was good.  (Suprep was used)  The instrument was then slowly withdrawn as the colon was fully examined. Estimated blood loss is zero unless otherwise noted in this procedure report.    COLON FINDINGS: A sessile polyp measuring 5 mm in size was found in the descending colon.  A polypectomy was performed with a cold snare.  The resection was complete, the polyp tissue was completely retrieved and sent to histology.   The examination was otherwise normal.  Retroflexed views revealed no abnormalities. The time to cecum = 4.6 Withdrawal time = 14.4   The scope was withdrawn and the procedure completed. COMPLICATIONS: There were no immediate  complications.  ENDOSCOPIC IMPRESSION: 1.   Sessile polyp in the descending colon; polypectomy performed with a cold snare 2.   The examination was otherwise normal  RECOMMENDATIONS: 1.  Await pathology results 2.  Repeat colonoscopy in 5 years if polyp adenomatous; otherwise 10 years  eSigned:  Ladene Artist, MD, [facility] 08/26/2015 3:46 PM

## 2015-08-29 ENCOUNTER — Telehealth: Payer: Self-pay | Admitting: *Deleted

## 2015-08-29 NOTE — Telephone Encounter (Signed)
  Follow up Call-  Call back number 08/26/2015  Post procedure Call Back phone  # [phone]  Permission to leave phone message Yes     No answer, left message.

## 2015-09-04 ENCOUNTER — Encounter: Payer: Self-pay | Admitting: Gastroenterology

## 2019-05-01 ENCOUNTER — Emergency Department (HOSPITAL_COMMUNITY)
Admission: EM | Admit: 2019-05-01 | Discharge: 2019-05-02 | Disposition: A | Discharge status: Home / Self Care | Payer: 59 | Attending: Emergency Medicine | Admitting: Emergency Medicine

## 2019-05-01 ENCOUNTER — Emergency Department (HOSPITAL_COMMUNITY): Payer: 59

## 2019-05-01 ENCOUNTER — Encounter (HOSPITAL_COMMUNITY): Payer: Self-pay | Admitting: Emergency Medicine

## 2019-05-01 ENCOUNTER — Other Ambulatory Visit: Payer: Self-pay

## 2019-05-01 DIAGNOSIS — N201 Calculus of ureter: Secondary | ICD-10-CM | POA: Diagnosis not present

## 2019-05-01 DIAGNOSIS — Z96652 Presence of left artificial knee joint: Secondary | ICD-10-CM | POA: Diagnosis not present

## 2019-05-01 DIAGNOSIS — R1031 Right lower quadrant pain: Secondary | ICD-10-CM | POA: Diagnosis present

## 2019-05-01 DIAGNOSIS — Z79899 Other long term (current) drug therapy: Secondary | ICD-10-CM | POA: Diagnosis not present

## 2019-05-01 LAB — URINALYSIS, ROUTINE W REFLEX MICROSCOPIC
Bilirubin Urine: NEGATIVE
Glucose, UA: NEGATIVE mg/dL
Ketones, ur: NEGATIVE mg/dL
Leukocytes,Ua: NEGATIVE
Nitrite: NEGATIVE
Protein, ur: NEGATIVE mg/dL
Specific Gravity, Urine: 1.012 (ref 1.005–1.030)
pH: 7 (ref 5.0–8.0)

## 2019-05-01 LAB — COMPREHENSIVE METABOLIC PANEL
ALT: 18 U/L (ref 0–44)
AST: 24 U/L (ref 15–41)
Albumin: 3.9 g/dL (ref 3.5–5.0)
Alkaline Phosphatase: 58 U/L (ref 38–126)
Anion gap: 8 (ref 5–15)
BUN: 11 mg/dL (ref 6–20)
CO2: 22 mmol/L (ref 22–32)
Calcium: 9.1 mg/dL (ref 8.9–10.3)
Chloride: 110 mmol/L (ref 98–111)
Creatinine, Ser: 0.95 mg/dL (ref 0.44–1.00)
GFR calc Af Amer: >60 mL/min (ref >60)
GFR calc non Af Amer: >60 mL/min (ref >60)
Glucose, Bld: 130 mg/dL — ABNORMAL HIGH (ref 70–99)
Potassium: 3.8 mmol/L (ref 3.5–5.1)
Sodium: 140 mmol/L (ref 135–145)
Total Bilirubin: 0.5 mg/dL (ref 0.3–1.2)
Total Protein: 7.3 g/dL (ref 6.5–8.1)

## 2019-05-01 LAB — CBC
HCT: 38.1 % (ref 36.0–46.0)
Hemoglobin: 13 g/dL (ref 12.0–15.0)
MCH: 32.4 pg (ref 26.0–34.0)
MCHC: 34.1 g/dL (ref 30.0–36.0)
MCV: 95 fL (ref 80.0–100.0)
Platelets: 262 10*3/uL (ref 150–400)
RBC: 4.01 MIL/uL (ref 3.87–5.11)
RDW: 11.9 % (ref 11.5–15.5)
WBC: 9.2 10*3/uL (ref 4.0–10.5)
nRBC: 0 % (ref 0.0–0.2)

## 2019-05-01 MED ORDER — MORPHINE SULFATE (PF) 4 MG/ML IV SOLN
4.0000 mg | Freq: Once | INTRAVENOUS | Status: AC
  Administered 2019-05-01: 4 mg via INTRAVENOUS
  Filled 2019-05-01: qty 1

## 2019-05-01 MED ORDER — OXYCODONE-ACETAMINOPHEN 5-325 MG PO TABS
1.0000 | ORAL_TABLET | ORAL | Status: DC | PRN
  Administered 2019-05-01: 1 via ORAL
  Filled 2019-05-01: qty 1

## 2019-05-01 MED ORDER — KETOROLAC TROMETHAMINE 30 MG/ML IJ SOLN
30.0000 mg | Freq: Once | INTRAMUSCULAR | Status: AC
  Administered 2019-05-01: 30 mg via INTRAVENOUS
  Filled 2019-05-01: qty 1

## 2019-05-01 MED ORDER — ONDANSETRON HCL 4 MG/2ML IJ SOLN
4.0000 mg | Freq: Once | INTRAMUSCULAR | Status: AC
  Administered 2019-05-01: 4 mg via INTRAVENOUS
  Filled 2019-05-01: qty 2

## 2019-05-01 NOTE — ED Notes (Signed)
[facility] [place] - Husband [phone], please call with update

## 2019-05-01 NOTE — ED Triage Notes (Signed)
Pt reports severe right sided flank pain that started 2 hours ago. Hx kidney stones. Reports one episode of vomiting PTA.
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
## 2019-05-02 MED ORDER — OXYCODONE HCL 5 MG PO TABS: 5.0000 mg | ORAL_TABLET | ORAL | 0 refills | Status: DC | PRN

## 2019-05-02 MED ORDER — IBUPROFEN 800 MG PO TABS: 800.0000 mg | ORAL_TABLET | Freq: Three times a day (TID) | ORAL | 0 refills | Status: AC

## 2019-05-02 MED ORDER — ONDANSETRON 4 MG PO TBDP: 4.0000 mg | ORAL_TABLET | Freq: Three times a day (TID) | ORAL | 0 refills | Status: DC | PRN

## 2019-05-02 MED ORDER — TAMSULOSIN HCL 0.4 MG PO CAPS: 0.4000 mg | ORAL_CAPSULE | Freq: Every day | ORAL | 0 refills | Status: DC

## 2019-05-02 NOTE — ED Notes (Signed)
Pt discharged with all belongings. Discharge instructions reviewed with pt, and pt verbalized understanding. Opportunity for questions provided.  

## 2019-05-02 NOTE — ED Provider Notes (Signed)
2:51 AM Assumed care from Dr. Rex Kras, please see their note for full history, physical and decision making until this point. In brief this is a 48 y.o. year old female who presented to the ED tonight with Flank Pain     Kidney stone. Not infected. Working on pain control.   Pain controlled. Will dc w/ uro follow up, rx for same.   Discharge instructions, including strict return precautions for new or worsening symptoms, given. Patient and/or family verbalized understanding and agreement with the plan as described.   Labs, studies and imaging reviewed by myself and considered in medical decision making if ordered. Imaging interpreted by radiology.  Labs Reviewed  URINALYSIS, ROUTINE W REFLEX MICROSCOPIC - Abnormal; Notable for the following components:      Result Value   APPearance CLOUDY (*)    Hgb urine dipstick SMALL (*)    Bacteria, UA RARE (*)    All other components within normal limits  COMPREHENSIVE METABOLIC PANEL - Abnormal; Notable for the following components:   Glucose, Bld 130 (*)    All other components within normal limits  URINE CULTURE  CBC    CT Renal Stone Study  Final Result      No follow-ups on file.    Merrily Pew, MD 05/06/19 [phone]

## 2019-05-03 LAB — URINE CULTURE

## 2019-05-06 ENCOUNTER — Other Ambulatory Visit: Payer: Self-pay | Admitting: Urology

## 2019-05-07 NOTE — Patient Instructions (Addendum)
YOU HAD A COVID TEST ON 05-08-2019. ONCE YOUR COVID TEST IS COMPLETED, PLEASE BEGIN THE QUARANTINE INSTRUCTIONS AS OUTLINED IN YOUR HANDOUT.                Tamara Moore     Your procedure is scheduled on: 05-12-2019  Report to [facility] Main  Entrance              Report to admitting at 730 AM      Call this number if you have problems the morning of surgery [phone]    Remember: Do not eat food or drink liquids :After Midnight. BRUSH YOUR TEETH MORNING OF SURGERY AND RINSE YOUR MOUTH OUT, NO CHEWING GUM CANDY OR MINTS.     Take these medicines the morning of surgery with A SIP OF WATER: Plaquenil, cymbalta, keflex, flomax                               You may not have any metal on your body including hair pins and              piercings  Do not wear jewelry, make-up, lotions, powders or perfumes, deodorant             Do not wear nail polish.  Do not shave  48 hours prior to surgery.               Do not bring valuables to the hospital. [place].  Contacts, dentures or bridgework may not be worn into surgery.      Patients discharged the day of surgery will not be allowed to drive home. IF YOU ARE HAVING SURGERY AND GOING HOME THE SAME DAY, YOU MUST HAVE AN ADULT TO DRIVE YOU HOME AND BE WITH YOU FOR 24 HOURS. YOU MAY GO HOME BY TAXI OR UBER OR ORTHERWISE, BUT AN ADULT MUST ACCOMPANY YOU HOME AND STAY WITH YOU FOR 24 HOURS.  Name and phone number of your driver:  Special Instructions: N/A              Please read over the following fact sheets you were given: _____________________________________________________________________             [facility] - Preparing for Surgery Before surgery, you can play an important role.  Because skin is not sterile, your skin needs to be as free of germs as possible.  You can reduce the number of germs on your skin by washing with CHG (chlorahexidine gluconate) soap  before surgery.  CHG is an antiseptic cleaner which kills germs and bonds with the skin to continue killing germs even after washing. Please DO NOT use if you have an allergy to CHG or antibacterial soaps.  If your skin becomes reddened/irritated stop using the CHG and inform your nurse when you arrive at Short Stay. Do not shave (including legs and underarms) for at least 48 hours prior to the first CHG shower.  You may shave your face/neck. Please follow these instructions carefully:  1.  Shower with CHG Soap the night before surgery and the  morning of Surgery.  2.  If you choose to wash your hair, wash your hair first as usual with your  normal  shampoo.  3.  After you shampoo, rinse your hair and body thoroughly to remove the  shampoo.                           4.  Use CHG as you would any other liquid soap.  You can apply chg directly  to the skin and wash                       Gently with a scrungie or clean washcloth.  5.  Apply the CHG Soap to your body ONLY FROM THE NECK DOWN.   Do not use on face/ open                           Wound or open sores. Avoid contact with eyes, ears mouth and genitals (private parts).                       Wash face,  Genitals (private parts) with your normal soap.             6.  Wash thoroughly, paying special attention to the area where your surgery  will be performed.  7.  Thoroughly rinse your body with warm water from the neck down.  8.  DO NOT shower/wash with your normal soap after using and rinsing off  the CHG Soap.                9.  Pat yourself dry with a clean towel.            10.  Wear clean pajamas.            11.  Place clean sheets on your bed the night of your first shower and do not  sleep with pets. Day of Surgery : Do not apply any lotions/deodorants the morning of surgery.  Please wear clean clothes to the hospital/surgery center.  FAILURE TO FOLLOW THESE INSTRUCTIONS MAY RESULT IN THE CANCELLATION OF YOUR SURGERY PATIENT  SIGNATURE_________________________________  NURSE SIGNATURE__________________________________  ________________________________________________________________________

## 2019-05-08 ENCOUNTER — Other Ambulatory Visit (HOSPITAL_COMMUNITY)
Admission: RE | Admit: 2019-05-08 | Discharge: 2019-05-08 | Disposition: A | Discharge status: Home / Self Care | Payer: 59 | Source: Ambulatory Visit | Attending: Urology | Admitting: Urology

## 2019-05-08 DIAGNOSIS — Z1159 Encounter for screening for other viral diseases: Secondary | ICD-10-CM | POA: Diagnosis not present

## 2019-05-08 DIAGNOSIS — Z01812 Encounter for preprocedural laboratory examination: Secondary | ICD-10-CM | POA: Insufficient documentation

## 2019-05-08 LAB — SARS CORONAVIRUS 2 (TAT 6-24 HRS): SARS Coronavirus 2: NEGATIVE

## 2019-05-11 ENCOUNTER — Encounter (HOSPITAL_COMMUNITY)
Admission: RE | Admit: 2019-05-11 | Discharge: 2019-05-11 | Disposition: A | Discharge status: Home / Self Care | Payer: 59 | Source: Ambulatory Visit | Attending: Urology | Admitting: Urology

## 2019-05-11 ENCOUNTER — Encounter (HOSPITAL_COMMUNITY): Payer: Self-pay

## 2019-05-11 ENCOUNTER — Other Ambulatory Visit: Payer: Self-pay

## 2019-05-11 DIAGNOSIS — F419 Anxiety disorder, unspecified: Secondary | ICD-10-CM | POA: Diagnosis not present

## 2019-05-11 DIAGNOSIS — N132 Hydronephrosis with renal and ureteral calculous obstruction: Secondary | ICD-10-CM | POA: Diagnosis not present

## 2019-05-11 DIAGNOSIS — I10 Essential (primary) hypertension: Secondary | ICD-10-CM | POA: Diagnosis not present

## 2019-05-11 DIAGNOSIS — Z87442 Personal history of urinary calculi: Secondary | ICD-10-CM | POA: Diagnosis not present

## 2019-05-11 DIAGNOSIS — Z79899 Other long term (current) drug therapy: Secondary | ICD-10-CM | POA: Diagnosis not present

## 2019-05-11 DIAGNOSIS — F329 Major depressive disorder, single episode, unspecified: Secondary | ICD-10-CM | POA: Diagnosis not present

## 2019-05-11 DIAGNOSIS — M1990 Unspecified osteoarthritis, unspecified site: Secondary | ICD-10-CM | POA: Diagnosis not present

## 2019-05-11 DIAGNOSIS — M3500 Sicca syndrome, unspecified: Secondary | ICD-10-CM | POA: Diagnosis not present

## 2019-05-11 DIAGNOSIS — G43909 Migraine, unspecified, not intractable, without status migrainosus: Secondary | ICD-10-CM | POA: Diagnosis not present

## 2019-05-11 DIAGNOSIS — M797 Fibromyalgia: Secondary | ICD-10-CM | POA: Diagnosis not present

## 2019-05-11 DIAGNOSIS — N201 Calculus of ureter: Secondary | ICD-10-CM | POA: Diagnosis present

## 2019-05-11 HISTORY — DX: Sjogren syndrome, unspecified: M35.00

## 2019-05-11 HISTORY — DX: Essential (primary) hypertension: I10

## 2019-05-11 LAB — BASIC METABOLIC PANEL
Anion gap: 8 (ref 5–15)
BUN: 11 mg/dL (ref 6–20)
CO2: 25 mmol/L (ref 22–32)
Calcium: 9.1 mg/dL (ref 8.9–10.3)
Chloride: 108 mmol/L (ref 98–111)
Creatinine, Ser: 0.75 mg/dL (ref 0.44–1.00)
GFR calc Af Amer: >60 mL/min (ref >60)
GFR calc non Af Amer: >60 mL/min (ref >60)
Glucose, Bld: 104 mg/dL — ABNORMAL HIGH (ref 70–99)
Potassium: 4 mmol/L (ref 3.5–5.1)
Sodium: 141 mmol/L (ref 135–145)

## 2019-05-11 LAB — CBC
HCT: 37.2 % (ref 36.0–46.0)
Hemoglobin: 12.7 g/dL (ref 12.0–15.0)
MCH: 32.5 pg (ref 26.0–34.0)
MCHC: 34.1 g/dL (ref 30.0–36.0)
MCV: 95.1 fL (ref 80.0–100.0)
Platelets: 280 10*3/uL (ref 150–400)
RBC: 3.91 MIL/uL (ref 3.87–5.11)
RDW: 11.7 % (ref 11.5–15.5)
WBC: 5.5 10*3/uL (ref 4.0–10.5)
nRBC: 0 % (ref 0.0–0.2)

## 2019-05-12 ENCOUNTER — Encounter (HOSPITAL_COMMUNITY): Payer: Self-pay | Admitting: *Deleted

## 2019-05-12 ENCOUNTER — Ambulatory Visit (HOSPITAL_COMMUNITY): Payer: 59 | Admitting: Certified Registered"

## 2019-05-12 ENCOUNTER — Encounter (HOSPITAL_COMMUNITY)
Admission: RE | Disposition: A | Discharge status: Home / Self Care | Payer: Self-pay | Source: Home / Self Care | Attending: Urology

## 2019-05-12 ENCOUNTER — Ambulatory Visit (HOSPITAL_COMMUNITY): Payer: 59

## 2019-05-12 ENCOUNTER — Ambulatory Visit (HOSPITAL_COMMUNITY)
Admission: RE | Admit: 2019-05-12 | Discharge: 2019-05-12 | Disposition: A | Discharge status: Home / Self Care | Payer: 59 | Attending: Urology | Admitting: Urology

## 2019-05-12 DIAGNOSIS — G43909 Migraine, unspecified, not intractable, without status migrainosus: Secondary | ICD-10-CM | POA: Insufficient documentation

## 2019-05-12 DIAGNOSIS — N132 Hydronephrosis with renal and ureteral calculous obstruction: Secondary | ICD-10-CM | POA: Insufficient documentation

## 2019-05-12 DIAGNOSIS — F329 Major depressive disorder, single episode, unspecified: Secondary | ICD-10-CM | POA: Insufficient documentation

## 2019-05-12 DIAGNOSIS — M3500 Sicca syndrome, unspecified: Secondary | ICD-10-CM | POA: Insufficient documentation

## 2019-05-12 DIAGNOSIS — M797 Fibromyalgia: Secondary | ICD-10-CM | POA: Insufficient documentation

## 2019-05-12 DIAGNOSIS — I10 Essential (primary) hypertension: Secondary | ICD-10-CM | POA: Insufficient documentation

## 2019-05-12 DIAGNOSIS — Z79899 Other long term (current) drug therapy: Secondary | ICD-10-CM | POA: Insufficient documentation

## 2019-05-12 DIAGNOSIS — F419 Anxiety disorder, unspecified: Secondary | ICD-10-CM | POA: Insufficient documentation

## 2019-05-12 DIAGNOSIS — Z87442 Personal history of urinary calculi: Secondary | ICD-10-CM | POA: Insufficient documentation

## 2019-05-12 DIAGNOSIS — M1990 Unspecified osteoarthritis, unspecified site: Secondary | ICD-10-CM | POA: Insufficient documentation

## 2019-05-12 HISTORY — PX: CYSTOSCOPY/URETEROSCOPY/HOLMIUM LASER/STENT PLACEMENT: SHX6546

## 2019-05-12 SURGERY — CYSTOSCOPY/URETEROSCOPY/HOLMIUM LASER/STENT PLACEMENT
Anesthesia: General | Laterality: Right

## 2019-05-12 MED ORDER — PROMETHAZINE HCL 25 MG/ML IJ SOLN: 6.2500 mg | INTRAMUSCULAR | Status: DC | PRN

## 2019-05-12 MED ORDER — SODIUM CHLORIDE 0.9 % IR SOLN
Status: DC | PRN
  Administered 2019-05-12: 3000 mL

## 2019-05-12 MED ORDER — HYDROMORPHONE HCL 1 MG/ML IJ SOLN
0.2500 mg | INTRAMUSCULAR | Status: DC | PRN
  Administered 2019-05-12 (×2): 0.5 mg via INTRAVENOUS

## 2019-05-12 MED ORDER — PROPOFOL 10 MG/ML IV BOLUS
INTRAVENOUS | Status: AC
  Filled 2019-05-12: qty 20

## 2019-05-12 MED ORDER — FENTANYL CITRATE (PF) 100 MCG/2ML IJ SOLN
INTRAMUSCULAR | Status: AC
  Filled 2019-05-12: qty 2

## 2019-05-12 MED ORDER — FENTANYL CITRATE (PF) 100 MCG/2ML IJ SOLN
INTRAMUSCULAR | Status: DC | PRN
  Administered 2019-05-12 (×4): 25 ug via INTRAVENOUS

## 2019-05-12 MED ORDER — LIDOCAINE 2% (20 MG/ML) 5 ML SYRINGE
INTRAMUSCULAR | Status: DC | PRN
  Administered 2019-05-12: 100 mg via INTRAVENOUS

## 2019-05-12 MED ORDER — DEXAMETHASONE SODIUM PHOSPHATE 10 MG/ML IJ SOLN
INTRAMUSCULAR | Status: AC
  Filled 2019-05-12: qty 1

## 2019-05-12 MED ORDER — HYDROMORPHONE HCL 1 MG/ML IJ SOLN
INTRAMUSCULAR | Status: AC
  Filled 2019-05-12: qty 1

## 2019-05-12 MED ORDER — MIDAZOLAM HCL 5 MG/5ML IJ SOLN
INTRAMUSCULAR | Status: DC | PRN
  Administered 2019-05-12: 2 mg via INTRAVENOUS

## 2019-05-12 MED ORDER — CEFAZOLIN SODIUM-DEXTROSE 2-4 GM/100ML-% IV SOLN
2.0000 g | Freq: Once | INTRAVENOUS | Status: AC
  Administered 2019-05-12: 2 g via INTRAVENOUS
  Filled 2019-05-12: qty 100

## 2019-05-12 MED ORDER — LACTATED RINGERS IV SOLN
INTRAVENOUS | Status: DC
  Administered 2019-05-12: 08:00:00 via INTRAVENOUS

## 2019-05-12 MED ORDER — DEXAMETHASONE SODIUM PHOSPHATE 10 MG/ML IJ SOLN
INTRAMUSCULAR | Status: DC | PRN
  Administered 2019-05-12: 10 mg via INTRAVENOUS

## 2019-05-12 MED ORDER — LIDOCAINE 2% (20 MG/ML) 5 ML SYRINGE
INTRAMUSCULAR | Status: AC
  Filled 2019-05-12: qty 5

## 2019-05-12 MED ORDER — 0.9 % SODIUM CHLORIDE (POUR BTL) OPTIME
TOPICAL | Status: DC | PRN
  Administered 2019-05-12: 1000 mL

## 2019-05-12 MED ORDER — MIDAZOLAM HCL 2 MG/2ML IJ SOLN
INTRAMUSCULAR | Status: AC
  Filled 2019-05-12: qty 2

## 2019-05-12 MED ORDER — CEPHALEXIN 500 MG PO CAPS: 500.0000 mg | ORAL_CAPSULE | Freq: Two times a day (BID) | ORAL | 0 refills | Status: DC

## 2019-05-12 MED ORDER — PROPOFOL 10 MG/ML IV BOLUS
INTRAVENOUS | Status: DC | PRN
  Administered 2019-05-12: 170 mg via INTRAVENOUS

## 2019-05-12 MED ORDER — ONDANSETRON HCL 4 MG/2ML IJ SOLN
INTRAMUSCULAR | Status: AC
  Filled 2019-05-12: qty 2

## 2019-05-12 MED ORDER — ONDANSETRON HCL 4 MG/2ML IJ SOLN
INTRAMUSCULAR | Status: DC | PRN
  Administered 2019-05-12: 4 mg via INTRAVENOUS

## 2019-05-12 MED ORDER — IOHEXOL 300 MG/ML  SOLN
INTRAMUSCULAR | Status: DC | PRN
  Administered 2019-05-12: 8 mL

## 2019-05-12 SURGICAL SUPPLY — 20 items
BAG URO CATCHER STRL LF (MISCELLANEOUS) ×2 IMPLANT
BASKET ZERO TIP NITINOL 2.4FR (BASKET) IMPLANT
BSKT STON RTRVL ZERO TP 2.4FR (BASKET)
CATH URET 5FR 28IN OPEN ENDED (CATHETERS) ×2 IMPLANT
CLOTH BEACON ORANGE TIMEOUT ST (SAFETY) ×2 IMPLANT
COVER WAND RF STERILE (DRAPES) IMPLANT
EXTRACTOR STONE NITINOL NGAGE (UROLOGICAL SUPPLIES) IMPLANT
FIBER LASER FLEXIVA 365 (UROLOGICAL SUPPLIES) IMPLANT
FIBER LASER TRAC TIP (UROLOGICAL SUPPLIES) IMPLANT
GLOVE BIOGEL M STRL SZ7.5 (GLOVE) ×2 IMPLANT
GOWN STRL REUS W/TWL XL LVL3 (GOWN DISPOSABLE) ×2 IMPLANT
GUIDEWIRE ZIPWRE .038 STRAIGHT (WIRE) ×1 IMPLANT
KIT TURNOVER KIT A (KITS) IMPLANT
MANIFOLD NEPTUNE II (INSTRUMENTS) ×2 IMPLANT
PACK CYSTO (CUSTOM PROCEDURE TRAY) ×2 IMPLANT
SHEATH URETERAL 12FRX35CM (MISCELLANEOUS) IMPLANT
STENT URET 6FRX24 CONTOUR (STENTS) ×1 IMPLANT
STENT URET 6FRX26 CONTOUR (STENTS) IMPLANT
TUBING CONNECTING 10 (TUBING) ×2 IMPLANT
TUBING UROLOGY SET (TUBING) ×2 IMPLANT

## 2019-05-12 NOTE — Anesthesia Preprocedure Evaluation (Signed)
Anesthesia Evaluation  Patient identified by MRN, date of birth, ID band Patient awake    Reviewed: Allergy & Precautions, NPO status , Patient's Chart, lab work & pertinent test results  Airway Mallampati: II  TM Distance: >3 FB Neck ROM: Full    Dental no notable dental hx.    Pulmonary neg pulmonary ROS,    Pulmonary exam normal breath sounds clear to auscultation       Cardiovascular hypertension, Pt. on medications negative cardio ROS Normal cardiovascular exam Rhythm:Regular Rate:Normal     Neuro/Psych  Headaches, Anxiety Depression negative psych ROS   GI/Hepatic negative GI ROS, Neg liver ROS,   Endo/Other  negative endocrine ROS  Renal/GU negative Renal ROS  negative genitourinary   Musculoskeletal negative musculoskeletal ROS (+)   Abdominal (+) + obese,   Peds negative pediatric ROS (+)  Hematology negative hematology ROS (+)   Anesthesia Other Findings   Reproductive/Obstetrics negative OB ROS                             Anesthesia Physical Anesthesia Plan  ASA: II  Anesthesia Plan: General   Post-op Pain Management:    Induction: Intravenous  PONV Risk Score and Plan: 3 and Ondansetron, Dexamethasone, Midazolam and Treatment may vary due to age or medical condition  Airway Management Planned: LMA  Additional Equipment:   Intra-op Plan:   Post-operative Plan: Extubation in OR  Informed Consent: I have reviewed the patients History and Physical, chart, labs and discussed the procedure including the risks, benefits and alternatives for the proposed anesthesia with the patient or authorized representative who has indicated his/her understanding and acceptance.     Dental advisory given  Plan Discussed with: CRNA  Anesthesia Plan Comments:         Anesthesia Quick Evaluation

## 2019-05-12 NOTE — Op Note (Signed)
Operative Note  Preoperative diagnosis:  1.  4 mm right mid ureteral calculus 2.  Right renal colic  Postoperative diagnosis: 1.  Passed right ureteral calculus  Procedure(s): 1.  Cystoscopy with right ureteroscopy and right JJ stent placement 2.  Right retrograde pyelogram with intraoperative interpretation of fluoroscopic imaging  Surgeon: Ellison Hughs, MD  Assistants:  None  Anesthesia:  General  Complications:  None  EBL: Less than 5 mL  Specimens: 1.  None  Drains/Catheters: 1.  Right 6 French, 24 cm JJ stent with tether  Intraoperative findings:   1. Solitary right collecting system with no filling defects or dilation involving the right ureter or right renal pelvis seen on retrograde pyelogram 2. There was a mild amount of erythema involving the distal aspects of the right ureter, likely from recent stone passage.  Indication:  Tamara Moore is a 48 y.o. female with a history of kidney stones and a 4 mm right mid ureteral calculus that was seen on CT from 05/01/2019.  She was initially given a trial of passage, but she continued to have right renal colic and is here today for the above procedures.  She has been consented for the above procedures, voices understanding and wishes to proceed.  Description of procedure:  After informed consent was obtained, the patient was brought to the operating room and general LMA anesthesia was administered. The patient was then placed in the dorsolithotomy position and prepped and draped in the usual sterile fashion. A timeout was performed. A 23 French rigid cystoscope was then inserted into the urethral meatus and advanced into the bladder under direct vision. A complete bladder survey revealed no intravesical pathology.  A 5 French ureteral catheter was then inserted into the right ureteral orifice and a retrograde pyelogram was obtained, with the findings listed above.  A Glidewire was then used to intubate the lumen of the  ureteral catheter and was advanced up to the right renal pelvis, under fluoroscopic guidance.  The catheter was then removed, leaving the wire in place.  A flexible ureteroscope was then advanced alongside the wire and up the right ureter.  I did not immediately identify any evidence of an obstructing right ureteral stone on flexible ureteroscopy, but did identify an area of mild erythema involving the distal aspects of the right ureter.  A full inspection of the right renal pelvis also was negative for any stone(s).  The flexible ureteroscope was then removed, leaving the wire in place.  A 6 French, 24 cm JJ stent was then placed over the wire and into good position within the right collecting system, confirming placement via fluoroscopy.  Plan: The patient has been instructed to remove her right JJ stent on 05/15/2019.

## 2019-05-12 NOTE — H&P (Signed)
PRE-OP H&P  CC: I have kidney stones.  HPI: Tamara Moore is a 48 year-old female patient who was referred by Dr. Merrily Pew, MD who is here for renal calculi.  The problem is on the right side. She first stated noticing pain on 05/01/2019. This is not her first kidney stone. She has had 3 stones prior to getting this one. She is currently having flank pain and nausea. She denies having back pain, groin pain, vomiting, fever, and chills. She has not caught a stone in her urine strainer since her symptoms began.   She has never had surgical treatment for calculi in the past.   -Today, she states that he pain is partially controlled with ibuprofen 800 mg and flomax. Cannot tolerate oxycodone 2/2/ nausea. AFVSS.  -On topamax for migraines   Labs 05/01/19  -Serum creatinine- 0.95  -WBC- 9.2  -UA- MH with few bacteria  -Urine culture with mixed flora      ALLERGIES: Aspir-81 TBEC    MEDICATIONS: Metoprolol Succinate 50 mg tablet, extended release 24 hr  Tamsulosin Hcl 0.4 mg capsule  Duloxetine Hcl 60 mg capsule,delayed release  Fish Oil  Hydroxychloroquine Sulfate 200 mg tablet  Multiple Vitamin  Topiramate 50 mg tablet     GU PSH: None     PSH Notes: B eye surgery for lazy eyes  ACL L knee    NON-GU PSH: Cesarean Delivery Hysterectomy, partial     GU PMH: None     PMH Notes: sjogren's syndrome   NON-GU PMH: Arthritis Fibromyalgia Sicca syndrome, unspecified    FAMILY HISTORY: 1 Daughter - No Family History Heart Disease - Mother, Father   SOCIAL HISTORY: Marital Status: Married Preferred Language: English; Race: White Current Smoking Status: Patient has never smoked.   Tobacco Use Assessment Completed: Used Tobacco in last 30 days? Does not use smokeless tobacco. Drinks 2 drinks per month.  Drinks 2 caffeinated drinks per day. Has not had a blood transfusion.    REVIEW OF SYSTEMS:    GU Review Female:   Patient reports burning /pain with urination.  Patient denies frequent urination, hard to postpone urination, get up at night to urinate, leakage of urine, stream starts and stops, trouble starting your stream, have to strain to urinate, and being pregnant.  Gastrointestinal (Upper):   Patient denies nausea, vomiting, and indigestion/ heartburn.  Gastrointestinal (Lower):   Patient denies diarrhea and constipation.  Constitutional:   Patient reports night sweats. Patient denies fever, weight loss, and fatigue.  Skin:   Patient denies skin rash/ lesion and itching.  Eyes:   Patient denies blurred vision and double vision.  Ears/ Nose/ Throat:   Patient denies sore throat and sinus problems.  Hematologic/Lymphatic:   Patient denies swollen glands and easy bruising.  Cardiovascular:   Patient denies leg swelling and chest pains.  Respiratory:   Patient denies cough and shortness of breath.  Endocrine:   Patient denies excessive thirst.  Musculoskeletal:   Patient reports back pain. Patient denies joint pain.  Neurological:   Patient reports headaches. Patient denies dizziness.  Psychologic:   Patient denies depression and anxiety.   Notes: R side flank , history of kidney stones , CT complete at ED     VITAL SIGNS:      05/04/2019 09:42 AM  Weight 185 lb / 83.91 kg  Height 69 in / 175.26 cm  BP 134/90 mmHg  Heart Rate 83 /min  Temperature 98.1 F / 36.7 C  BMI 27.3 kg/m  MULTI-SYSTEM PHYSICAL EXAMINATION:    Constitutional: Well-nourished. No physical deformities. Normally developed. Good grooming.  Neck: Neck symmetrical, not swollen. Normal tracheal position.  Respiratory: No labored breathing, no use of accessory muscles.   Cardiovascular: Normal temperature, normal extremity pulses, no swelling, no varicosities.  Skin: No paleness, no jaundice, no cyanosis. No lesion, no ulcer, no rash.  Neurologic / Psychiatric: Oriented to time, oriented to place, oriented to person. No depression, no anxiety, no agitation.   Gastrointestinal: No mass, no tenderness, no rigidity, non obese abdomen.  Eyes: Normal conjunctivae. Normal eyelids.  Musculoskeletal: Normal gait and station of head and neck.     PAST DATA REVIEWED:  Source Of History:  Patient  Notes:                     CLINICAL DATA: Flank pain. Recurrent stone disease.     EXAM:  CT ABDOMEN AND PELVIS WITHOUT CONTRAST     TECHNIQUE:  Multidetector CT imaging of the abdomen and pelvis was performed  following the standard protocol without IV contrast.     COMPARISON: November 19, 2011     FINDINGS:  Lower chest: No acute abnormality.     Hepatobiliary: No focal liver abnormality is seen. No gallstones,  gallbladder wall thickening, or biliary dilatation.     Pancreas: Unremarkable. No pancreatic ductal dilatation or  surrounding inflammatory changes.     Spleen: Normal in size without focal abnormality.     Adrenals/Urinary Tract: There is a 3.7 mm stone in the proximal  right ureter resulting in mild hydronephrosis, and proximal right  ureterectasis. There is minimal adjacent fat stranding as well. The  kidneys and ureters are otherwise normal in appearance. The adrenal  glands are normal. The bladder is unremarkable.     Stomach/Bowel: The stomach and small bowel are normal. The colon is  normal. Visualized portions of the appendix are unremarkable. No  evidence of appendicitis.     Vascular/Lymphatic: No significant vascular findings are present. No  enlarged abdominal or pelvic lymph nodes.     Reproductive: Status post hysterectomy. No adnexal masses.     Other: No abdominal wall hernia or abnormality. No abdominopelvic  ascites.     Musculoskeletal: No acute or significant osseous findings.     IMPRESSION:  1. There is a 3.7 mm stone in the proximal right ureter resulting in  mild hydronephrosis and ureterectasis. Minimal adjacent stranding as  well.  2. No other acute abnormalities.        Electronically Signed   By: Dorise Bullion III M.D  On: 05/01/2019 21:44   PROCEDURES:         Renal Ultrasound (Limited) - 85462  Right Kidney: Length: 12.1 cm Depth: 5.9 cm Cortical Width: 1.5 cm Width: 5.5 cm    Right Kidney/Ureter:  Hydronephrosis noted.   Bladder:  PVR = 27.7 ml. The patient last voided about an hour prior to this exam.       Patient confirmed No Neulasta OnPro Device.             KUB - [id number]  A single view of the abdomen is obtained.      Patient confirmed No Neulasta OnPro Device. There is a 4 mm calcification seen along the expected course of the right ureter, corresponding to the stone see on recent CT. There are no identifiable calcifications seen within the left renal shadow or along the expected course of the left ureter. No calcifications noted within  bladder. No boney abnormalities. No abnormal bowel/gas patterns or signs of free air.           Urinalysis w/Scope Dipstick Dipstick Cont'd Micro  Color: Yellow Bilirubin: Neg mg/dL WBC/hpf: >60/hpf  Appearance: Cloudy Ketones: Neg mg/dL RBC/hpf: 10 - 20/hpf  Specific Gravity: 1.020 Blood: 1+ ery/uL Bacteria: Many (>50/hpf)  pH: 6.0 Protein: Trace mg/dL Cystals: NS (Not Seen)  Glucose: Neg mg/dL Urobilinogen: 0.2 mg/dL Casts: NS (Not Seen)    Nitrites: Neg Trichomonas: Not Present    Leukocyte Esterase: Neg leu/uL Mucous: Not Present      Epithelial Cells: NS (Not Seen)      Yeast: NS (Not Seen)      Sperm: Not Present         Morphine 4mg  - J2270, 02585 2mg  disposed of Gwen witnessed    Qty: 2 Adm. By: Verlee Monte  Unit: mg Lot No [id number]  Route: IM Exp. Date 08/29/2020  Freq: None Mfgr.:   Site: Left Buttock         Phenergan 25mg  - 96372, J2550 12.5 remaining    Qty: 12.5 Adm. By: Verlee Monte  Unit: mg Lot No [id number]  Route: IM Exp. Date 04/28/2020  Freq: None Mfgr.:   Site: Right Buttock   ASSESSMENT:      ICD-10 Details  1 GU:   Ureteral calculus - N20.1 3.7 mm right mid-ureteral stone  2    Hydronephrosis - N13.0 Right   PLAN:            Medications New Meds: Keflex 500 mg capsule 1 capsule PO BID   #10  0 Refill(s)  Norco 5 mg-325 mg tablet 1 tablet PO Q 4 H PRN   #20  0 Refill(s)  Promethazine Hcl 12.5 mg tablet 1 tablet PO Q 4 H PRN   #30  1 Refill(s)           Orders Labs Urine Culture  X-Rays: KUB    Renal Ultrasound (Limited) - Right rus     Assessment and Plan:  The risks, benefits and alternatives of cystoscopy with RIGHT ureteroscopy, laser lithotripsy and ureteral stent placement was discussed the patient.  Risks included, but are not limited to: bleeding, urinary tract infection, ureteral injury/avulsion, ureteral stricture formation, retained stone fragments, the possibility that multiple surgeries may be required to treat the stone(s), MI, stroke, PE and the inherent risks of general anesthesia.  The patient voices understanding and wishes to proceed.

## 2019-05-12 NOTE — Anesthesia Procedure Notes (Signed)
Procedure Name: LMA Insertion Date/Time: 05/12/2019 9:33 AM Performed by: Sharlette Dense, CRNA Patient Re-evaluated:Patient Re-evaluated prior to induction Oxygen Delivery Method: Circle system utilized Preoxygenation: Pre-oxygenation with 100% oxygen Induction Type: IV induction LMA: LMA inserted LMA Size: 4.0 Number of attempts: 1 Placement Confirmation: positive ETCO2 and breath sounds checked- equal and bilateral Tube secured with: Tape Dental Injury: Teeth and Oropharynx as per pre-operative assessment

## 2019-05-12 NOTE — Discharge Instructions (Signed)

## 2019-05-12 NOTE — Transfer of Care (Signed)
Immediate Anesthesia Transfer of Care Note  Patient: Tamara Moore  Procedure(s) Performed: CYSTOSCOPY/URETEROSCOPY/STENT PLACEMENT (Right )  Patient Location: PACU  Anesthesia Type:General  Level of Consciousness: awake  Airway & Oxygen Therapy: Patient Spontanous Breathing and Patient connected to face mask oxygen  Post-op Assessment: Report given to RN and Post -op Vital signs reviewed and stable  Post vital signs: Reviewed and stable  Last Vitals:  Vitals Value Taken Time  BP    Temp    Pulse    Resp    SpO2      Last Pain:  Vitals:   05/12/19 0743  TempSrc: Oral         Complications: No apparent anesthesia complications

## 2019-05-12 NOTE — Anesthesia Postprocedure Evaluation (Signed)
Anesthesia Post Note  Patient: Tamara Moore  Procedure(s) Performed: CYSTOSCOPY/URETEROSCOPY/STENT PLACEMENT (Right )     Patient location during evaluation: PACU Anesthesia Type: General Level of consciousness: awake and alert Pain management: pain level controlled Vital Signs Assessment: post-procedure vital signs reviewed and stable Respiratory status: spontaneous breathing, nonlabored ventilation and respiratory function stable Cardiovascular status: blood pressure returned to baseline and stable Postop Assessment: no apparent nausea or vomiting Anesthetic complications: no    Last Vitals:  Vitals:   05/12/19 1100 05/12/19 1115  BP: (!) 154/96 (!) 156/99  Pulse: 87 80  Resp: 17 16  Temp:  36.9 C  SpO2: 95% 97%    Last Pain:  Vitals:   05/12/19 1115  TempSrc:   PainSc: 0-No pain                 Lynda Rainwater

## 2019-05-13 ENCOUNTER — Encounter (HOSPITAL_COMMUNITY): Payer: Self-pay | Admitting: Urology

## 2019-06-19 ENCOUNTER — Ambulatory Visit: Payer: 59 | Admitting: Family Medicine

## 2019-06-24 ENCOUNTER — Ambulatory Visit: Payer: 59 | Admitting: Family Medicine

## 2019-06-24 DIAGNOSIS — Z0289 Encounter for other administrative examinations: Secondary | ICD-10-CM

## 2019-07-01 ENCOUNTER — Encounter: Payer: Self-pay | Admitting: Family Medicine

## 2020-01-20 ENCOUNTER — Telehealth: Payer: Self-pay

## 2020-01-20 NOTE — Telephone Encounter (Signed)
Tried contacting patient for a new patient appointment, lvm.

## 2020-01-21 ENCOUNTER — Ambulatory Visit: Payer: 59 | Attending: Internal Medicine

## 2020-01-21 DIAGNOSIS — Z23 Encounter for immunization: Secondary | ICD-10-CM

## 2020-01-21 NOTE — Progress Notes (Signed)
   Covid-19 Vaccination Clinic  Name:  [name]    MRN: [id number] DOB: 03/07/1971  01/21/2020  [name] was observed post Covid-19 immunization for 15 minutes without incident. She was provided with Vaccine Information Sheet and instruction to access the V-Safe system.   [name] was instructed to call 911 with any severe reactions post vaccine: Marland Kitchen Difficulty breathing  . Swelling of face and throat  . A fast heartbeat  . A bad rash all over body  . Dizziness and weakness   Immunizations Administered    Name Date Dose VIS Date Route   Pfizer COVID-19 Vaccine 01/21/2020  9:32 AM 0.3 mL 10/09/2019 Intramuscular   Manufacturer: [place]   Lot: [id number]   [place]: [id number]

## 2020-02-15 ENCOUNTER — Ambulatory Visit: Payer: 59 | Attending: Internal Medicine

## 2020-02-15 DIAGNOSIS — Z23 Encounter for immunization: Secondary | ICD-10-CM

## 2020-02-15 NOTE — Progress Notes (Signed)
   Covid-19 Vaccination Clinic  Name:  Tamara Moore    MRN: [id number] DOB: Aug 24, 1971  02/15/2020  [name] was observed post Covid-19 immunization for 15 minutes without incident. She was provided with Vaccine Information Sheet and instruction to access the V-Safe system.   [name] was instructed to call 911 with any severe reactions post vaccine: Marland Kitchen Difficulty breathing  . Swelling of face and throat  . A fast heartbeat  . A bad rash all over body  . Dizziness and weakness   Immunizations Administered    Name Date Dose VIS Date Route   Pfizer COVID-19 Vaccine 02/15/2020  9:22 AM 0.3 mL 12/23/2018 Intramuscular   Manufacturer: [place]   Lot: [id number]   [place]: [id number]

## 2020-03-17 ENCOUNTER — Telehealth: Payer: Self-pay | Admitting: Neurology

## 2020-03-17 NOTE — Telephone Encounter (Signed)
I've been asked to expedite patient's appointment from July to sooner. We are going to try and get her in sooner. If you see any new patient spots open up please do not fill them until we get her moved up, (let me know if you see anything) thanks!

## 2020-03-21 NOTE — Telephone Encounter (Signed)
Noted will help keep an eye out for an opening.

## 2020-03-23 NOTE — Telephone Encounter (Signed)
We had a last minute cancellation for tomorrow AM at 8:30 (5/27). I called pt & LVM offering this. I told pt I would call her tomorrow AM around 7 as the phone lines don't open until 8. Left office number in message.

## 2020-03-23 NOTE — Telephone Encounter (Signed)
I already spoke to patient and bumped her up to the 7:30 on 6/30 and let her know we'd reach back out if something opened up sooner.

## 2020-03-23 NOTE — Telephone Encounter (Signed)
Nikki: You can offer her June 28th one of the emg spots in the afternoon that are open and if something else opens up we will keep her on the wait list. Thanks

## 2020-03-23 NOTE — Telephone Encounter (Signed)
That's perfect thank you 

## 2020-03-24 NOTE — Telephone Encounter (Signed)
I called pt again this AM and LVM informing pt that the 8:30 appt is still open. Left office number in message. Advised pt we still have her scheduled for 04/27/20 @ 7:30 AM.
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
## 2020-04-27 ENCOUNTER — Encounter: Payer: Self-pay | Admitting: Neurology

## 2020-04-27 ENCOUNTER — Telehealth: Payer: Self-pay | Admitting: Neurology

## 2020-04-27 ENCOUNTER — Ambulatory Visit (INDEPENDENT_AMBULATORY_CARE_PROVIDER_SITE_OTHER): Payer: 59 | Admitting: Neurology

## 2020-04-27 ENCOUNTER — Other Ambulatory Visit: Payer: Self-pay

## 2020-04-27 VITALS — BP 149/100 | HR 71 | Ht 68.5 in | Wt 220.0 lb

## 2020-04-27 DIAGNOSIS — R5383 Other fatigue: Secondary | ICD-10-CM

## 2020-04-27 DIAGNOSIS — H53453 Other localized visual field defect, bilateral: Secondary | ICD-10-CM | POA: Diagnosis not present

## 2020-04-27 DIAGNOSIS — G43711 Chronic migraine without aura, intractable, with status migrainosus: Secondary | ICD-10-CM | POA: Diagnosis not present

## 2020-04-27 DIAGNOSIS — G4719 Other hypersomnia: Secondary | ICD-10-CM

## 2020-04-27 DIAGNOSIS — R519 Headache, unspecified: Secondary | ICD-10-CM | POA: Diagnosis not present

## 2020-04-27 DIAGNOSIS — H538 Other visual disturbances: Secondary | ICD-10-CM

## 2020-04-27 MED ORDER — RIZATRIPTAN BENZOATE 10 MG PO TBDP: 10.0000 mg | ORAL_TABLET | ORAL | 11 refills | Status: DC | PRN

## 2020-04-27 MED ORDER — AJOVY 225 MG/1.5ML ~~LOC~~ SOAJ: 225.0000 mg | SUBCUTANEOUS | 0 refills | Status: DC

## 2020-04-27 NOTE — Patient Instructions (Addendum)
MRI of the brain - we will call Botox for migraines - we will call Blood work Discussed Ajovy, will proceed with botox Stop Sumatriptan, start Rizatriptan: Please take one tablet at the onset of your headache. If it does not improve the symptoms please take one additional tablet. Do not take more then 2 tablets in 24hrs. Do not take use more then 2 to 3 times in a week.  Rizatriptan disintegrating tablets What is this medicine? RIZATRIPTAN (rye za TRIP tan) is used to treat migraines with or without aura. An aura is a strange feeling or visual disturbance that warns you of an attack. It is not used to prevent migraines. This medicine may be used for other purposes; ask your health care provider or pharmacist if you have questions. COMMON BRAND NAME(S): Maxalt-MLT What should I tell my health care provider before I take this medicine? They need to know if you have any of these conditions:  cigarette smoker  circulation problems in fingers and toes  diabetes  heart disease  high blood pressure  high cholesterol  history of irregular heartbeat  history of stroke  kidney disease  liver disease  stomach or intestine problems  an unusual or allergic reaction to rizatriptan, other medicines, foods, dyes, or preservatives  pregnant or trying to get pregnant  breast-feeding How should I use this medicine? Take this medicine by mouth. Follow the directions on the prescription label. Leave the tablet in the sealed blister pack until you are ready to take it. With dry hands, open the blister and gently remove the tablet. If the tablet breaks or crumbles, throw it away and take a new tablet out of the blister pack. Place the tablet in the mouth and allow it to dissolve, and then swallow. Do not cut, crush, or chew this medicine. You do not need water to take this medicine. Do not take it more often than directed. Talk to your pediatrician regarding the use of this medicine in children.  While this drug may be prescribed for children as young as 6 years for selected conditions, precautions do apply. Overdosage: If you think you have taken too much of this medicine contact a poison control center or emergency room at once. NOTE: This medicine is only for you. Do not share this medicine with others. What if I miss a dose? This does not apply. This medicine is not for regular use. What may interact with this medicine? Do not take this medicine with any of the following medicines:  certain medicines for migraine headache like almotriptan, eletriptan, frovatriptan, naratriptan, rizatriptan, sumatriptan, zolmitriptan  ergot alkaloids like dihydroergotamine, ergonovine, ergotamine, methylergonovine  MAOIs like Carbex, Eldepryl, Marplan, Nardil, and Parnate This medicine may also interact with the following medications:  certain medicines for depression, anxiety, or psychotic disorders  propranolol This list may not describe all possible interactions. Give your health care provider a list of all the medicines, herbs, non-prescription drugs, or dietary supplements you use. Also tell them if you smoke, drink alcohol, or use illegal drugs. Some items may interact with your medicine. What should I watch for while using this medicine? Visit your healthcare professional for regular checks on your progress. Tell your healthcare professional if your symptoms do not start to get better or if they get worse. You may get drowsy or dizzy. Do not drive, use machinery, or do anything that needs mental alertness until you know how this medicine affects you. Do not stand up or sit up quickly, especially  if you are an older patient. This reduces the risk of dizzy or fainting spells. Alcohol may interfere with the effect of this medicine. Your mouth may get dry. Chewing sugarless gum or sucking hard candy and drinking plenty of water may help. Contact your healthcare professional if the problem does not  go away or is severe. If you take migraine medicines for 10 or more days a month, your migraines may get worse. Keep a diary of headache days and medicine use. Contact your healthcare professional if your migraine attacks occur more frequently. What side effects may I notice from receiving this medicine? Side effects that you should report to your doctor or health care professional as soon as possible:  allergic reactions like skin rash, itching or hives, swelling of the face, lips, or tongue  chest pain or chest tightness  signs and symptoms of a dangerous change in heartbeat or heart rhythm like chest pain; dizziness; fast, irregular heartbeat; palpitations; feeling faint or lightheaded; falls; breathing problems  signs and symptoms of a stroke like changes in vision; confusion; trouble speaking or understanding; severe headaches; sudden numbness or weakness of the face, arm or leg; trouble walking; dizziness; loss of balance or coordination  signs and symptoms of serotonin syndrome like irritable; confusion; diarrhea; fast or irregular heartbeat; muscle twitching; stiff muscles; trouble walking; sweating; high fever; seizures; chills; vomiting Side effects that usually do not require medical attention (report to your doctor or health care professional if they continue or are bothersome):  diarrhea  dizziness  drowsiness  dry mouth  headache  nausea, vomiting  pain, tingling, numbness in the hands or feet  stomach pain This list may not describe all possible side effects. Call your doctor for medical advice about side effects. You may report side effects to FDA at 1-800-FDA-1088. Where should I keep my medicine? Keep out of the reach of children. Store at room temperature between 15 and 30 degrees C (59 and 86 degrees F). Protect from light and moisture. Throw away any unused medicine after the expiration date. NOTE: This sheet is a summary. It may not cover all possible  information. If you have questions about this medicine, talk to your doctor, pharmacist, or health care provider.  2020 Elsevier/Gold Standard (2018-04-29 14:58:08) Rolanda Lundborg injection What is this medicine? FREMANEZUMAB (fre ma NEZ ue mab) is used to prevent migraine headaches. This medicine may be used for other purposes; ask your health care provider or pharmacist if you have questions. COMMON BRAND NAME(S): AJOVY What should I tell my health care provider before I take this medicine? They need to know if you have any of these conditions:  an unusual or allergic reaction to fremanezumab, other medicines, foods, dyes, or preservatives  pregnant or trying to get pregnant  breast-feeding How should I use this medicine? This medicine is for injection under the skin. You will be taught how to prepare and give this medicine. Use exactly as directed. Take your medicine at regular intervals. Do not take your medicine more often than directed. It is important that you put your used needles and syringes in a special sharps container. Do not put them in a trash can. If you do not have a sharps container, call your pharmacist or healthcare provider to get one. Talk to your pediatrician regarding the use of this medicine in children. Special care may be needed. Overdosage: If you think you have taken too much of this medicine contact a poison control center or emergency room at  once. NOTE: This medicine is only for you. Do not share this medicine with others. What if I miss a dose? If you miss a dose, take it as soon as you can. If it is almost time for your next dose, take only that dose. Do not take double or extra doses. What may interact with this medicine? Interactions are not expected. This list may not describe all possible interactions. Give your health care provider a list of all the medicines, herbs, non-prescription drugs, or dietary supplements you use. Also tell them if you smoke,  drink alcohol, or use illegal drugs. Some items may interact with your medicine. What should I watch for while using this medicine? Tell your doctor or healthcare professional if your symptoms do not start to get better or if they get worse. What side effects may I notice from receiving this medicine? Side effects that you should report to your doctor or health care professional as soon as possible:  allergic reactions like skin rash, itching or hives, swelling of the face, lips, or tongue Side effects that usually do not require medical attention (report these to your doctor or health care professional if they continue or are bothersome):  pain, redness, or irritation at site where injected This list may not describe all possible side effects. Call your doctor for medical advice about side effects. You may report side effects to FDA at 1-800-FDA-1088. Where should I keep my medicine? Keep out of the reach of children. You will be instructed on how to store this medicine. Throw away any unused medicine after the expiration date on the label. NOTE: This sheet is a summary. It may not cover all possible information. If you have questions about this medicine, talk to your doctor, pharmacist, or health care provider.  2020 Elsevier/Gold Standard (2017-07-15 17:22:56) OnabotulinumtoxinA injection (Medical Use) What is this medicine? ONABOTULINUMTOXINA (o na BOTT you lye num tox in eh) is a neuro-muscular blocker. This medicine is used to treat crossed eyes, eyelid spasms, severe neck muscle spasms, ankle and toe muscle spasms, and elbow, wrist, and finger muscle spasms. It is also used to treat excessive underarm sweating, to prevent chronic migraine headaches, and to treat loss of bladder control due to neurologic conditions such as multiple sclerosis or spinal cord injury. This medicine may be used for other purposes; ask your health care provider or pharmacist if you have questions. COMMON BRAND  NAME(S): Botox What should I tell my health care provider before I take this medicine? They need to know if you have any of these conditions:  breathing problems  cerebral palsy spasms  difficulty urinating  heart problems  history of surgery where this medicine is going to be used  infection at the site where this medicine is going to be used  myasthenia gravis or other neurologic disease  nerve or muscle disease  surgery plans  take medicines that treat or prevent blood clots  thyroid problems  an unusual or allergic reaction to botulinum toxin, albumin, other medicines, foods, dyes, or preservatives  pregnant or trying to get pregnant  breast-feeding How should I use this medicine? This medicine is for injection into a muscle. It is given by a health care professional in a hospital or clinic setting. Talk to your pediatrician regarding the use of this medicine in children. While this drug may be prescribed for children as young as 83 years old for selected conditions, precautions do apply. Overdosage: If you think you have taken too much  of this medicine contact a poison control center or emergency room at once. NOTE: This medicine is only for you. Do not share this medicine with others. What if I miss a dose? This does not apply. What may interact with this medicine?  aminoglycoside antibiotics like gentamicin, neomycin, tobramycin  muscle relaxants  other botulinum toxin injections This list may not describe all possible interactions. Give your health care provider a list of all the medicines, herbs, non-prescription drugs, or dietary supplements you use. Also tell them if you smoke, drink alcohol, or use illegal drugs. Some items may interact with your medicine. What should I watch for while using this medicine? Visit your doctor for regular check ups. This medicine will cause weakness in the muscle where it is injected. Tell your doctor if you feel unusually weak  in other muscles. Get medical help right away if you have problems with breathing, swallowing, or talking. This medicine might make your eyelids droop or make you see blurry or double. If you have weak muscles or trouble seeing do not drive a car, use machinery, or do other dangerous activities. This medicine contains albumin from human blood. It may be possible to pass an infection in this medicine, but no cases have been reported. Talk to your doctor about the risks and benefits of this medicine. If your activities have been limited by your condition, go back to your regular routine slowly after treatment with this medicine. What side effects may I notice from receiving this medicine? Side effects that you should report to your doctor or health care professional as soon as possible:  allergic reactions like skin rash, itching or hives, swelling of the face, lips, or tongue  breathing problems  changes in vision  chest pain or tightness  eye irritation, pain  fast, irregular heartbeat  infection  numbness  speech problems  swallowing problems  unusual weakness Side effects that usually do not require medical attention (report to your doctor or health care professional if they continue or are bothersome):  bruising or pain at site where injected  drooping eyelid  dry eyes or mouth  headache  muscles aches, pains  sensitivity to light  tearing This list may not describe all possible side effects. Call your doctor for medical advice about side effects. You may report side effects to FDA at 1-800-FDA-1088. Where should I keep my medicine? This drug is given in a hospital or clinic and will not be stored at home. NOTE: This sheet is a summary. It may not cover all possible information. If you have questions about this medicine, talk to your doctor, pharmacist, or health care provider.  2020 Elsevier/Gold Standard (2018-04-21 14:21:42)

## 2020-04-27 NOTE — Telephone Encounter (Signed)
Botox charge sheet completed, pending MD signature. Pt will need to sign consent at first injection appt.

## 2020-04-27 NOTE — Telephone Encounter (Signed)
Dx V67.014.

## 2020-04-27 NOTE — Addendum Note (Signed)
Addended by: Sarina Ill B on: 04/27/2020 08:22 AM   Modules accepted: Orders

## 2020-04-27 NOTE — Telephone Encounter (Signed)
Please start botox approval thanks she can go to amy, megan or myself

## 2020-04-28 LAB — COMPREHENSIVE METABOLIC PANEL
ALT: 14 IU/L (ref 0–32)
AST: 15 IU/L (ref 0–40)
Albumin/Globulin Ratio: 1.3 (ref 1.2–2.2)
Albumin: 4 g/dL (ref 3.8–4.8)
Alkaline Phosphatase: 76 IU/L (ref 48–121)
BUN/Creatinine Ratio: 13 (ref 9–23)
BUN: 10 mg/dL (ref 6–24)
Bilirubin Total: 0.3 mg/dL (ref 0.0–1.2)
CO2: 24 mmol/L (ref 20–29)
Calcium: 9.4 mg/dL (ref 8.7–10.2)
Chloride: 102 mmol/L (ref 96–106)
Creatinine, Ser: 0.8 mg/dL (ref 0.57–1.00)
GFR calc Af Amer: 101 mL/min/{1.73_m2} (ref >59)
GFR calc non Af Amer: 87 mL/min/{1.73_m2} (ref >59)
Globulin, Total: 3.1 g/dL (ref 1.5–4.5)
Glucose: 98 mg/dL (ref 65–99)
Potassium: 4 mmol/L (ref 3.5–5.2)
Sodium: 137 mmol/L (ref 134–144)
Total Protein: 7.1 g/dL (ref 6.0–8.5)

## 2020-04-28 LAB — CBC WITH DIFFERENTIAL/PLATELET
Basophils Absolute: 0 10*3/uL (ref 0.0–0.2)
Basos: 1 %
EOS (ABSOLUTE): 0 10*3/uL (ref 0.0–0.4)
Eos: 1 %
Hematocrit: 38 % (ref 34.0–46.6)
Hemoglobin: 13.1 g/dL (ref 11.1–15.9)
Immature Grans (Abs): 0 10*3/uL (ref 0.0–0.1)
Immature Granulocytes: 0 %
Lymphocytes Absolute: 1.9 10*3/uL (ref 0.7–3.1)
Lymphs: 43 %
MCH: 31.8 pg (ref 26.6–33.0)
MCHC: 34.5 g/dL (ref 31.5–35.7)
MCV: 92 fL (ref 79–97)
Monocytes Absolute: 0.5 10*3/uL (ref 0.1–0.9)
Monocytes: 10 %
Neutrophils Absolute: 2 10*3/uL (ref 1.4–7.0)
Neutrophils: 45 %
Platelets: 273 10*3/uL (ref 150–450)
RBC: 4.12 x10E6/uL (ref 3.77–5.28)
RDW: 13.1 % (ref 11.7–15.4)
WBC: 4.5 10*3/uL (ref 3.4–10.8)

## 2020-04-28 LAB — TSH: TSH: 1.33 u[IU]/mL (ref 0.450–4.500)

## 2020-05-03 NOTE — Telephone Encounter (Signed)
In process 

## 2020-05-03 NOTE — Telephone Encounter (Signed)
[employer] PA form filled out and given to MD to sign.

## 2020-05-04 NOTE — Telephone Encounter (Signed)
Faxed PA to [place] [phone]). Waiting for response.

## 2020-05-05 ENCOUNTER — Telehealth: Payer: Self-pay | Admitting: Neurology

## 2020-05-05 NOTE — Telephone Encounter (Signed)
[employer] pending faxed notes  

## 2020-05-09 NOTE — Telephone Encounter (Signed)
LVM for pt to call back about scheduling Tamara Moore: [id number] (exp. 05/05/20 to 08/03/20)

## 2020-05-10 ENCOUNTER — Ambulatory Visit: Payer: 59 | Admitting: Neurology

## 2020-05-12 NOTE — Telephone Encounter (Signed)
I called the number on the back of patient's insurance card [phone]) and spoke with Erline Levine to check on status of PA. She states that they have not received anything. She asked me what fax number I used and told me that it was incorrect. She provided me with the correct fax number for patient's plan [phone]) and I faxed the request to that number. I also spoke with Baker Janus to check on benefits for patient. I asked her which specialty pharmacy we should use. She told me that patient would be buy and bill. Waiting for response.

## 2020-05-12 NOTE — Telephone Encounter (Signed)
Left message for patient to call back and schedule MRI

## 2020-05-12 NOTE — Telephone Encounter (Signed)
x2 Left voicemail for patient to call back about scheduling.

## 2020-05-12 NOTE — Telephone Encounter (Signed)
Pt returned call and LVM to schedule her MRI.

## 2020-05-16 NOTE — Telephone Encounter (Signed)
Received approval fax from [facility] for [id number]. PA #[id number] (05/12/20- 05/28/21).

## 2020-05-31 ENCOUNTER — Ambulatory Visit: Payer: 59 | Admitting: Neurology

## 2020-05-31 ENCOUNTER — Encounter: Payer: Self-pay | Admitting: Neurology

## 2020-05-31 VITALS — BP 126/84 | HR 70 | Ht 69.0 in | Wt 219.3 lb

## 2020-05-31 DIAGNOSIS — G4719 Other hypersomnia: Secondary | ICD-10-CM

## 2020-05-31 DIAGNOSIS — R42 Dizziness and giddiness: Secondary | ICD-10-CM | POA: Insufficient documentation

## 2020-05-31 DIAGNOSIS — M797 Fibromyalgia: Secondary | ICD-10-CM

## 2020-05-31 DIAGNOSIS — G43711 Chronic migraine without aura, intractable, with status migrainosus: Secondary | ICD-10-CM

## 2020-05-31 DIAGNOSIS — G9332 Myalgic encephalomyelitis/chronic fatigue syndrome: Secondary | ICD-10-CM | POA: Insufficient documentation

## 2020-05-31 DIAGNOSIS — R519 Headache, unspecified: Secondary | ICD-10-CM

## 2020-05-31 DIAGNOSIS — R5382 Chronic fatigue, unspecified: Secondary | ICD-10-CM

## 2020-05-31 NOTE — Patient Instructions (Signed)

## 2020-05-31 NOTE — Progress Notes (Signed)
SLEEP MEDICINE CLINIC    Provider:  [name], MD  Primary Care Physician:  Glenis Smoker, MD [place] [id number]     Referring Provider: Dr Jaynee Eagles.         Chief Complaint according to patient   Patient presents with:    . New Patient (Initial Visit)           HISTORY OF PRESENT ILLNESS:  Tamara Moore is a 49  Year- old Caucasian female patient and seen here upon Dr. Cathren Laine  a referral on 05/31/2020 .   Chief concern according to patient : " I typically wake up with a dull headache, not a migraine' rarely being woken by headaches". And : I am very fatigued and very sleepy- all day long.    I have the pleasure of seeing Tamara Moore today, a right -handed White or Caucasian female with a possible sleep disorder.  She h has a past medical history of Anxiety, Chronic headaches, Depression, Fibromyalgia, Hemorrhoids, History of kidney stones, Hypertension, Irritable bowel syndrome (IBS), Migraine, Osteoarthritis, Sjogren's syndrome ([place]), and Thyroid disease.   Sleep relevant medical history: Nocturia 1-3, no Tonsillectomy. Insomnia- f difficulties falling asleep, history of RLS.     Family medical /sleep history:mother is the other family member on CPAP with OSA.  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 Tobacco use :none .  ETOH use seldomly ,  Caffeine intake in form of Coffee( one in AM ) Soda( quit) Tea ( seldoly) no energy drinks. Regular exercise in form of walking. .   Hobbies : reading.       Sleep habits are as follows: The patient's dinner time is between 6-7 PM. The patient goes to bed at 10PM and has trouble to fall asleep- the bedroom is cool, quiet and dark- she gets diaphoretic at night. She continues to sleep for 6 hours, wakes for one prolonged break, the first time at 2-3 AM.   The preferred  sleep position is on her side, with the support of 2 pillows.  Adjustable bed 12 degrees- Dreams are reportedly infrequent.  Interestingly, naps are associated with dreams.   6-7 AM is the usual rise time. The patient wakes up with an alarm.  She reports not feeling refreshed or restored in AM, with symptoms such as dry mouth, morning headaches, and residual fatigue.  Naps are taken frequently, after noon time- lasting from 60-90 minutes and are less refreshing than nocturnal sleep.    Review of Systems: Out of a complete 14 system review, the patient complains of only the following symptoms, and all other reviewed systems are negative.:  Fatigue,  Extreme sleepiness , snoring, fragmented sleep, Insomnia - one long break in the middle of nighttime sleep. f dreams in naps.    How likely are you to doze in the following situations: 0 = not likely, 1 = slight chance, 2 = moderate chance, 3 = high chance   Sitting and Reading? Watching Television? Sitting inactive in a public place (theater or meeting)? As a passenger in a car for an hour without a break? Lying down in the afternoon when circumstances permit? Sitting and talking to someone? Sitting quietly after lunch without alcohol? In a car, while stopped for a few minutes in traffic?   Total = 15/ 24 points   FSS  endorsed at 62/ 63 points.   Social History   Socioeconomic History  . Marital status: Married    Spouse name: Not on file  . Number of children: 1  . Years of education: Not on file  . Highest education level: Bachelor's degree (e.g., BA, AB, BS)  Occupational History  . Occupation: Publishing rights manager business  Tobacco Use  . Smoking status: Never Smoker  . Smokeless tobacco: Never Used  Vaping Use  . Vaping Use: Never used  Substance and Sexual Activity  . Alcohol use: Yes    Alcohol/week: 0.0 standard drinks    Comment: 1-2 x month  . Drug use: Never  . Sexual activity: Yes    Birth control/protection:  Surgical  Other Topics Concern  . Not on file  Social History Narrative   Lives at home with husband, daughter   Caffeine: 1-2 cups/day   Social Determinants of Health   Financial Resource Strain:   . Difficulty of Paying Living Expenses:   Food Insecurity:   . Worried About Charity fundraiser in the Last Year:   . Arboriculturist in the Last Year:   Transportation Needs:   . Film/video editor (Medical):   Marland Kitchen Lack of Transportation (Non-Medical):   Physical Activity:   . Days of Exercise per Week:   . Minutes of Exercise per Session:   Stress:   . Feeling of Stress :   Social Connections:   . Frequency of Communication with Friends and Family:   . Frequency of Social Gatherings with Friends and Family:   . Attends Religious Services:   . Active Member of Clubs or Organizations:   . Attends Archivist Meetings:   Marland Kitchen Marital Status:     Family History  Problem Relation Age of Onset  . Breast cancer Maternal Grandmother   . Ulcers Maternal Grandmother   . Stomach cancer Maternal Grandmother   . Stroke Mother   . Hypertension Mother   . Hyperlipidemia Mother   . Irritable bowel syndrome Mother   . Heart attack Mother   . Hypertension Father   . Hyperlipidemia Father   . Other Father        stroke of spinal cord  . Diabetes Maternal Aunt   . Colon cancer Neg Hx     Past Medical History:  Diagnosis Date  . Anxiety   . Chronic headaches   . Depression   . Fibromyalgia   . Hemorrhoids   . History of kidney stones   . Hypertension   . Irritable bowel syndrome (IBS)   . Migraine   . Osteoarthritis   . Sjogren's syndrome ([place])    not related to Lupus  . Thyroid disease    history of nodules     Past Surgical History:  Procedure Laterality Date  . acl replacement  2010  . CESAREAN SECTION  2001  . CYSTOSCOPY/URETEROSCOPY/HOLMIUM LASER/STENT PLACEMENT Right 05/12/2019   Procedure: CYSTOSCOPY/URETEROSCOPY/STENT PLACEMENT;  Surgeon: Ceasar Mons, MD;  Location: [facility];  Service: Urology;  Laterality: Right;  ONLY NEEDS 45 MIN FOR PROCEDURE  . DENTAL SURGERY     to remove teeth (all but 4)  . EYE SURGERY  1979   several eye surgeries as a child  . KNEE ARTHROPLASTY Left 2011   x 2  . PARTIAL HYSTERECTOMY  2013     Current Outpatient Medications on File Prior to Visit  Medication Sig Dispense Refill  . acetaminophen (TYLENOL)  500 MG tablet Take 1,000-1,500 mg by mouth every 6 (six) hours as needed for moderate pain or headache.    Marland Kitchen amLODipine (NORVASC) 5 MG tablet Take 5 mg by mouth daily.    Marland Kitchen aspirin-acetaminophen-caffeine (EXCEDRIN MIGRAINE) 250-250-65 MG tablet Take 2 tablets by mouth every 6 (six) hours as needed for headache.    . DULoxetine (CYMBALTA) 60 MG capsule Take 60 mg by mouth daily.    . Fremanezumab-vfrm (AJOVY) 225 MG/1.5ML SOAJ Inject into the skin.    . hydroxychloroquine (PLAQUENIL) 200 MG tablet Take 400 mg by mouth at bedtime.     Marland Kitchen ibuprofen (ADVIL) 800 MG tablet Take 1 tablet (800 mg total) by mouth 3 (three) times daily. 21 tablet 0  . metoprolol succinate (TOPROL-XL) 100 MG 24 hr tablet Take 100 mg by mouth at bedtime.    . Multiple Vitamin (MULTIVITAMIN WITH MINERALS) TABS tablet Take 1 tablet by mouth at bedtime.     . rizatriptan (MAXALT-MLT) 10 MG disintegrating tablet Take 1 tablet (10 mg total) by mouth as needed for migraine. May repeat in 2 hours if needed 9 tablet 11  . SUMAtriptan (IMITREX) 50 MG tablet Take 50 mg by mouth as needed.     No current facility-administered medications on file prior to visit.    Allergies  Allergen Reactions  . Aspirin Hives    Baby aspirin only  . Oxycodone Nausea And Vomiting    Physical exam:  Today's Vitals   05/31/20 1112  BP: 126/84  Pulse: 70  SpO2: 98%  Weight: 219 lb 5 oz (99.5 kg)  Height: 5\' 9"  (1.753 m)   Body mass index is 32.39 kg/m.   Wt Readings from Last 3 Encounters:  05/31/20 219 lb 5 oz (99.5 kg)  04/27/20  220 lb (99.8 kg)  05/11/19 210 lb 3.2 oz (95.3 kg)     Ht Readings from Last 3 Encounters:  05/31/20 5\' 9"  (1.753 m)  04/27/20 5' 8.5" (1.74 m)  05/11/19 5\' 9"  (1.753 m)      General: The patient is awake, alert and appears not in acute distress. The patient is well groomed. Head: Normocephalic, atraumatic. Neck is supple. Mallampati : 2,  neck circumference: 15.5  inches . There is a goiter(!)  Nasal airflow  patent.  Retrognathia is not  seen.  Dental status: intact  Cardiovascular:  Regular rate and cardiac rhythm by pulse,  without distended neck veins. Respiratory: Lungs are clear to auscultation.  Skin:  Without evidence of ankle edema, or rash. Trunk: The patient's posture is erect.   Neurologic exam : The patient is awake and alert, oriented to place and time.   Memory subjective described as intact.  Attention span & concentration ability appears normal.  Speech is fluent,  without  dysarthria, dysphonia or aphasia.  Mood and affect are appropriate.   Cranial nerves: no loss of smell or taste reported  Pupils are equal and briskly reactive to light. Funduscopic exam deferred. .  Extraocular movements in vertical and horizontal planes were intact and without nystagmus. No Diplopia. Visual fields by finger perimetry are intact. Hearing was intact to soft voice and finger rubbing.    Facial sensation intact to fine touch.  Facial motor strength is symmetric and tongue and uvula move midline.  Neck ROM : rotation, tilt and flexion extension were normal for age and shoulder shrug was symmetrical.    Motor exam:  Symmetric bulk, tone and ROM.   Normal tone without cog-wheeling, symmetric  grip strength- she has arthritis.  .   Sensory:  Fine touch and vibration were tested  and  normal. Numbness in arms and fingers reported - when waking up.  Proprioception tested in the upper extremities was normal.   Coordination: Rapid alternating movements in the fingers/hands were of  normal speed.  The Finger-to-nose maneuver was intact without evidence of ataxia, dysmetria or tremor. She reports a dizziness when laying down- vertigo, feels spinning of the surrounding     Gait and station: Patient could rise unassisted from a seated position, walked without assistive device.  Stance is of normal width/ base and the patient turned with 4 steps.  Toe and heel walk were deferred.  Deep tendon reflexes: in the  upper and lower extremities are symmetric and intact.  Babinski response was deferred .      After spending a total time of  45  minutes face to face and additional time for physical and neurologic examination, review of laboratory studies,  personal review of imaging studies, reports and results of other testing and review of referral information / records as far as provided in visit, I have established the following assessments:  The patient reports that since she started Ajovy her migraine frequency has been reduced by about 50% from 2-week to 1 week, her sleep-related headaches however have not been influenced by this medication and continue the same pattern.   1) my goal is to evaluate this patient for the possible presence of obstructive sleep apnea, mainly ruling out hypoxemia as a cluster headache or sleep-related headache risk factor. 2) the patient has a history of headaches that cause nausea but not necessarily vomiting and more recently has developed vertigo spells as well.  She does not have an aura to her migraine.  She has tried magnesium and amitriptyline in the past before Ajovy was added.  She also has been on metoprolol, sumatriptan, Cymbalta, Topamax, Tylenol and Decadron.  Current blood pressure is very well controlled and her past visits with Dr. Chong Sicilian blood pressure was higher at 149/100.  There is no clonus no reported asymmetry of reflexes normal strength.  My goal is for the patient to undergo either home sleep test for an attended sleep study to  find out why she is so excessively daytime fatigue also excessively daytime sleepy at her Epworth score 15 points.  Narcolepsy could be in the differential diagnosis that is usually associated with vivid dreams at night sleep fragmentation following a vivid dream, sometimes the feeling of being paralyzed in the morning when waking up and not being able to move for a 2nd or 2, also cataplexy the associated loss of muscle tone when emotionally upset, laughing loud or being angry. 3)    My Plan is to proceed with:  1) attended PSG or HST  2) narcolepsy panel.  3) hypersomnia work up.   I would like to thank Glenis Smoker, MD and Glenis Smoker, Md [place],  [place] [id number] for allowing me to meet with and to take care of this pleasant patient.   I plan to follow up either personally or through Dr Jaynee Eagles within 3 month.     Electronically signed by: [name], MD 05/31/2020 11:15 AM  [facility] Board certified by The [employer] of Sleep Medicine and Diplomate of the [employer] of Sleep Medicine. Board certified In Neurology through the [place], Fellow of the [employer] of Neurology. Medical Director  of [facility].

## 2020-06-02 ENCOUNTER — Telehealth: Payer: Self-pay

## 2020-06-02 IMAGING — CT CT RENAL STONE PROTOCOL
2 of 4 series · 16 of 46 positions shown, 18 images · non-contrast
Comparison: November 19, 2011

CLINICAL DATA: Flank pain.  Recurrent stone disease.

EXAM:
CT ABDOMEN AND PELVIS WITHOUT CONTRAST
TECHNIQUE: Multidetector CT imaging of the abdomen and pelvis was performed
following the standard protocol without IV contrast.

[Series 3: renal stone 5.0 · axial · 0.92mm/px · z∈[-563,-73]mm · 13 of 108 slices shown, 15 images]
[im 5/108  soft-tissue]
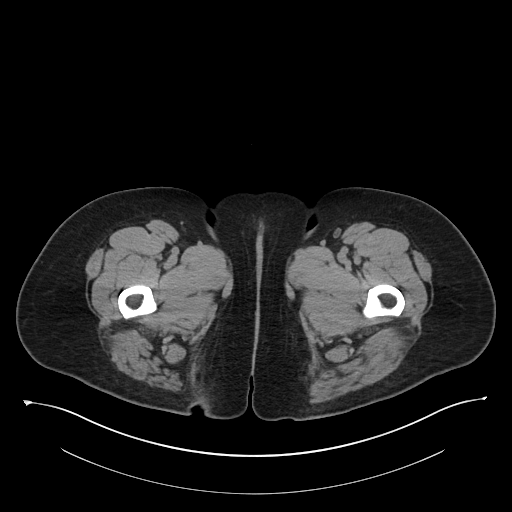
[im 5/108  bone]
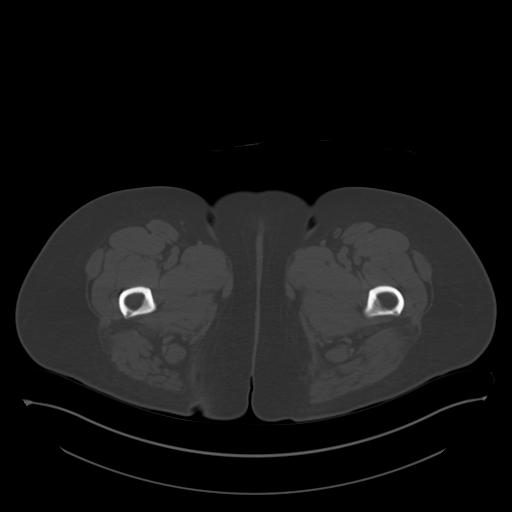
[im 15/108  soft-tissue]
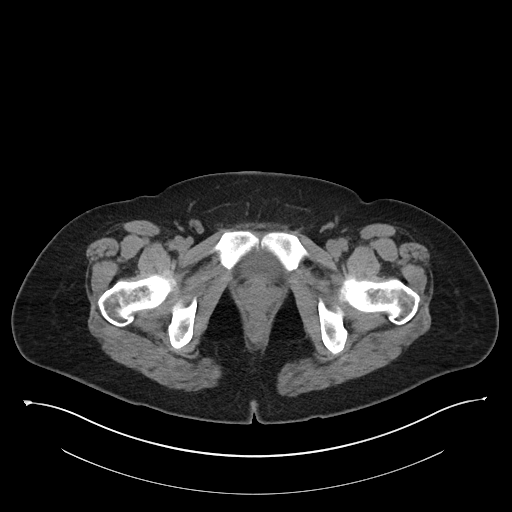
[im 25/108  soft-tissue]
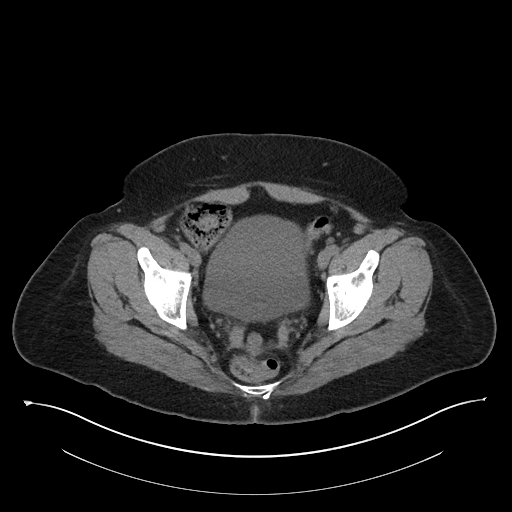
[im 30/108  soft-tissue]
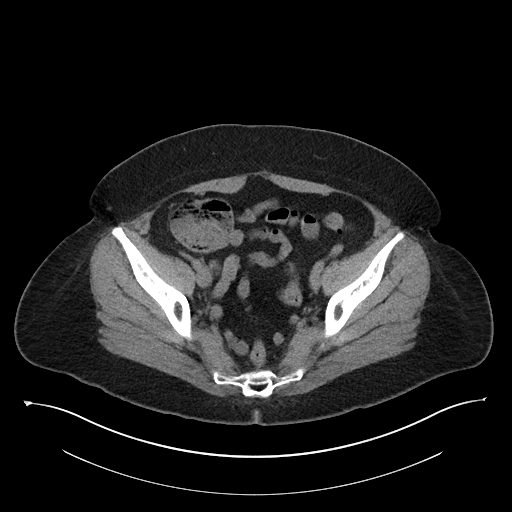
[im 39/108  soft-tissue]
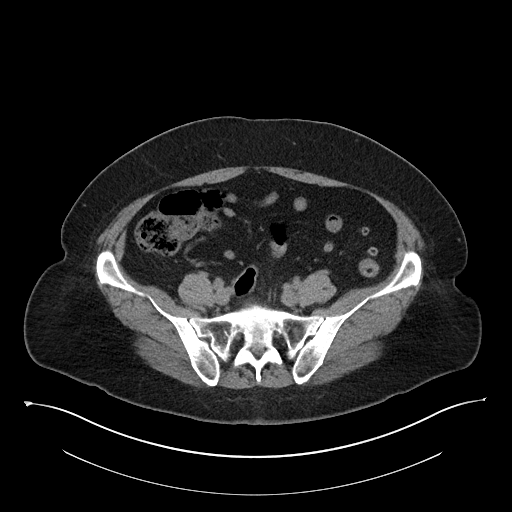
[im 44/108  soft-tissue]
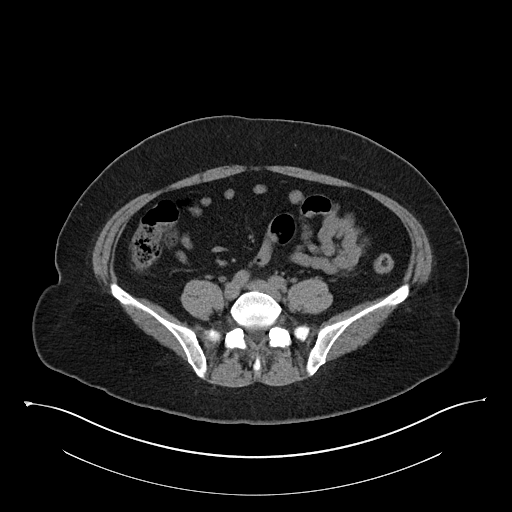
[im 54/108  soft-tissue]
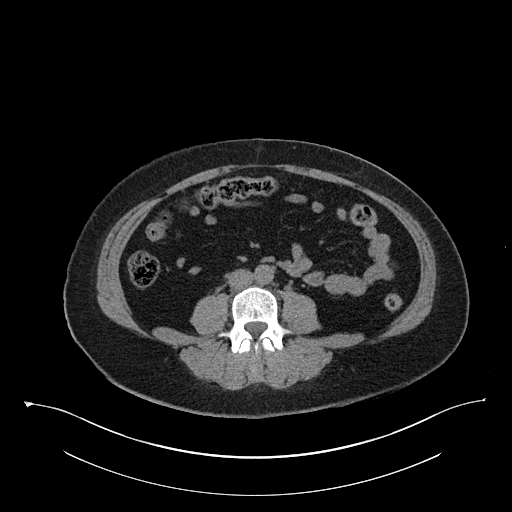
[im 64/108  soft-tissue]
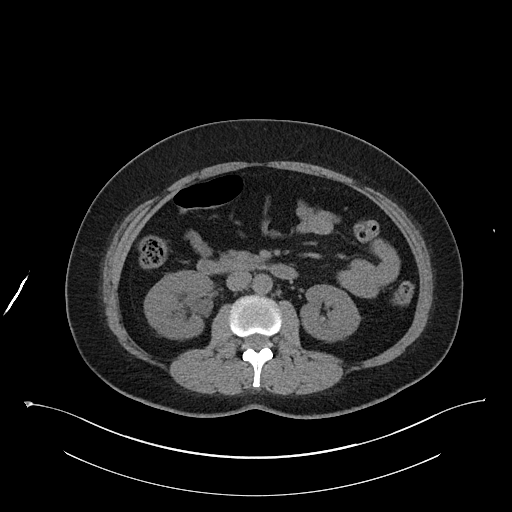
[im 69/108  soft-tissue]
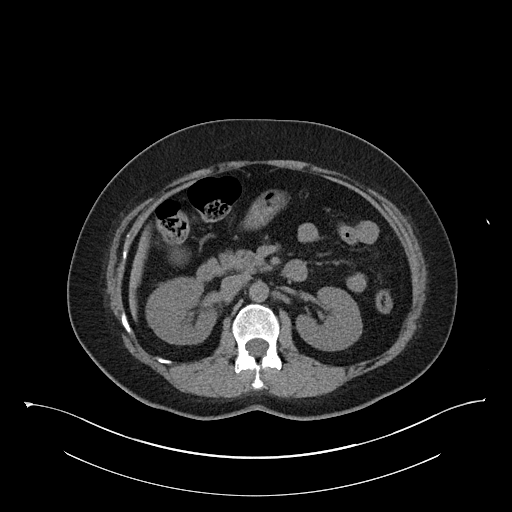
[im 69/108  bone]
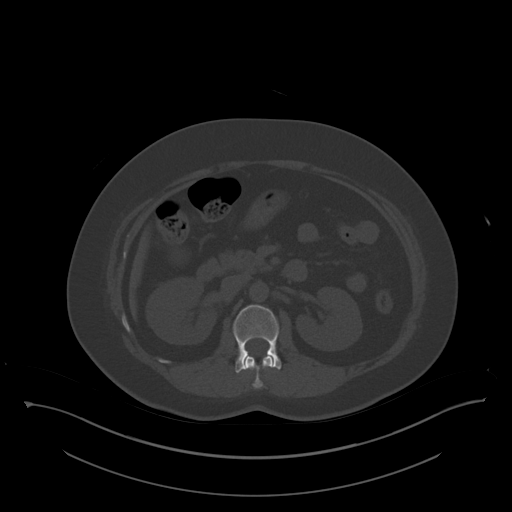
[im 78/108  soft-tissue]
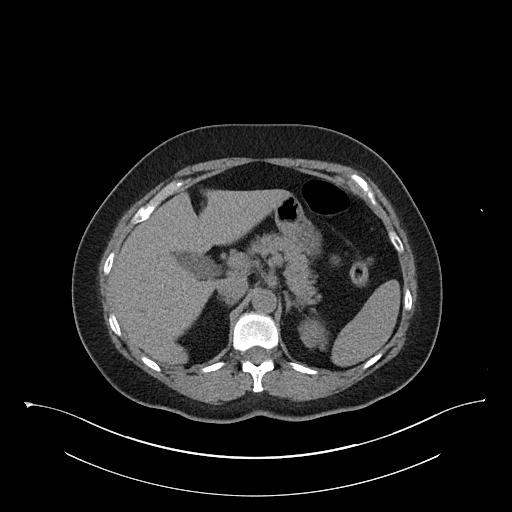
[im 83/108  soft-tissue]
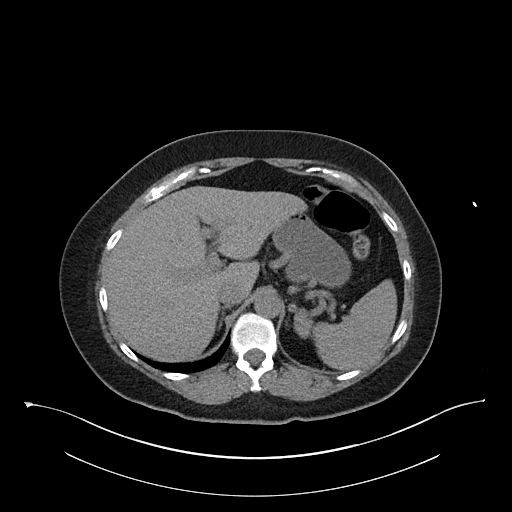
[im 93/108  soft-tissue]
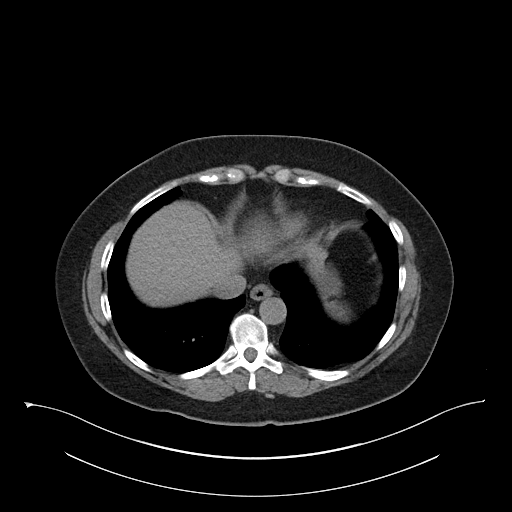
[im 103/108  soft-tissue]
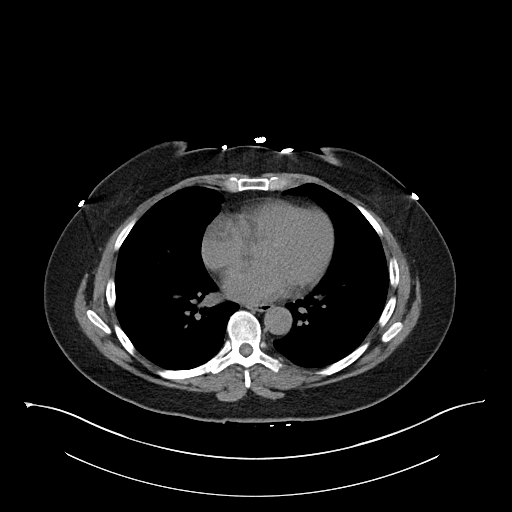

[Series 5: renal stone 3.0 cor · coronal · 0.82mm/px · 3 of 101 slices shown]
[im 34/101  soft-tissue]
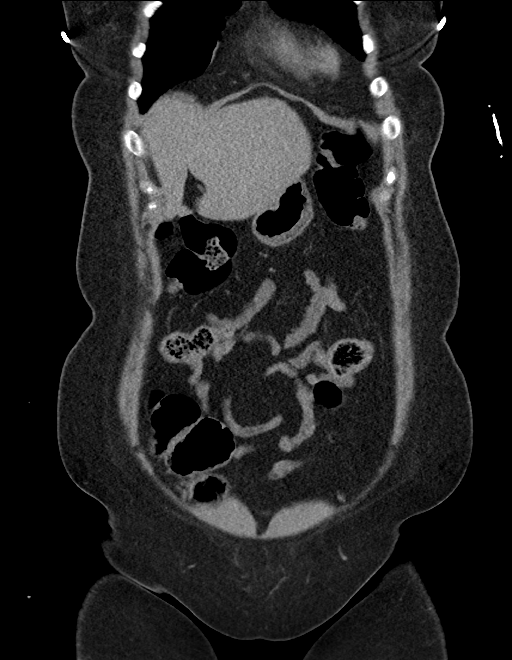
[im 45/101  soft-tissue]
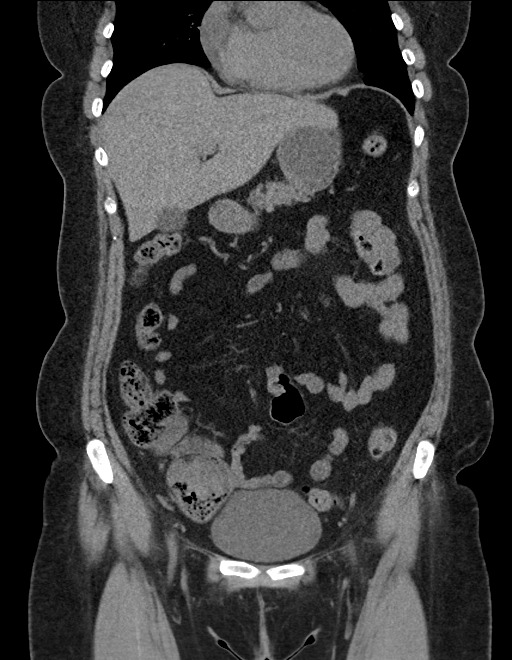
[im 56/101  soft-tissue]
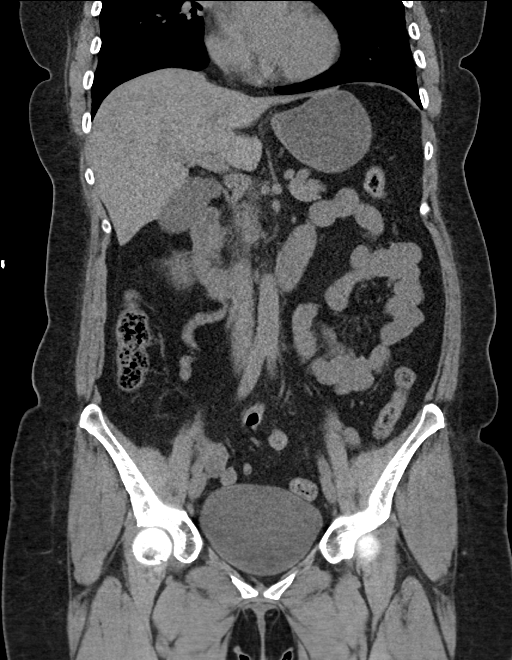

[16 of 46 positions shown; findings below may reference images not displayed]

FINDINGS: Lower chest: No acute abnormality.

Hepatobiliary: No focal liver abnormality is seen. No gallstones,
gallbladder wall thickening, or biliary dilatation.

Pancreas: Unremarkable. No pancreatic ductal dilatation or
surrounding inflammatory changes.

Spleen: Normal in size without focal abnormality.

Adrenals/Urinary Tract: There is a 3.7 mm stone in the proximal
right ureter resulting in mild hydronephrosis, and proximal right
ureterectasis. There is minimal adjacent fat stranding as well. The
kidneys and ureters are otherwise normal in appearance. The adrenal
glands are normal. The bladder is unremarkable.

Stomach/Bowel: The stomach and small bowel are normal. The colon is
normal. Visualized portions of the appendix are unremarkable. No
evidence of appendicitis.

Vascular/Lymphatic: No significant vascular findings are present. No
enlarged abdominal or pelvic lymph nodes.

Reproductive: Status post hysterectomy. No adnexal masses.

Other: No abdominal wall hernia or abnormality. No abdominopelvic
ascites.

Musculoskeletal: No acute or significant osseous findings.
IMPRESSION: 1. There is a 3.7 mm stone in the proximal right ureter resulting in
mild hydronephrosis and ureterectasis. Minimal adjacent stranding as
well.
2. No other acute abnormalities.

## 2020-06-02 NOTE — Telephone Encounter (Signed)
LVM for pt to call me back to schedule sleep study  

## 2020-06-08 LAB — NARCOLEPSY EVALUATION
DQA1*01:02: NEGATIVE
DQB1*06:02: NEGATIVE

## 2020-06-09 NOTE — Progress Notes (Signed)
Negative HLA alleles for narcolepsy.

## 2020-06-13 ENCOUNTER — Encounter: Payer: Self-pay | Admitting: Neurology

## 2020-06-15 ENCOUNTER — Other Ambulatory Visit: Payer: Self-pay

## 2020-06-15 ENCOUNTER — Ambulatory Visit (INDEPENDENT_AMBULATORY_CARE_PROVIDER_SITE_OTHER): Payer: 59 | Admitting: Neurology

## 2020-06-15 DIAGNOSIS — M797 Fibromyalgia: Secondary | ICD-10-CM

## 2020-06-15 DIAGNOSIS — G4710 Hypersomnia, unspecified: Secondary | ICD-10-CM | POA: Diagnosis not present

## 2020-06-15 DIAGNOSIS — R519 Headache, unspecified: Secondary | ICD-10-CM

## 2020-06-15 DIAGNOSIS — G9332 Myalgic encephalomyelitis/chronic fatigue syndrome: Secondary | ICD-10-CM

## 2020-06-15 DIAGNOSIS — G43711 Chronic migraine without aura, intractable, with status migrainosus: Secondary | ICD-10-CM

## 2020-06-15 DIAGNOSIS — R42 Dizziness and giddiness: Secondary | ICD-10-CM

## 2020-06-15 DIAGNOSIS — G4719 Other hypersomnia: Secondary | ICD-10-CM

## 2020-06-15 NOTE — Telephone Encounter (Signed)
[employer] preferred specialty pharmacy is [facility]. Could you send patient's Botox prescription there?

## 2020-06-16 MED ORDER — BOTOX 200 UNITS IJ SOLR: INTRAMUSCULAR | 3 refills | Status: DC

## 2020-06-16 NOTE — Telephone Encounter (Signed)
Botox inadvertently sent to [facility] instead of [facility] this AM. I sent the Rx to [facility] and called [facility] and canceled the Botox Rx.

## 2020-06-16 NOTE — Addendum Note (Signed)
Addended by: Gildardo Griffes on: 06/16/2020 07:57 AM   Modules accepted: Orders

## 2020-06-21 NOTE — Telephone Encounter (Signed)
I called [facility] and spoke to [facility] to check status. She states the order is being processed and they are checking benefits. I let her know that patient has an appointment on 8/31 and asked her if there was anything she could do to speed the process up. She states she will mark it as a stat order.

## 2020-06-27 NOTE — Progress Notes (Addendum)
Consent Form Botulism Toxin Injection For Chronic Migraine   Discussed today: Sleep evaluation: IMPRESSION: There was highly fragmented sleep, yet no physiological causes were identified. No apnea, snoring, hypoxia or PLMs.  These spontaneous arousals can be attributed to pain or discomfort in many autoimmune, post viral or rheumatological pain conditions.  06/28/2020: This is our first botox. At baseline, she has daily headaches and at least 8 migraine days a month that are moderately severe to severe.Baseline:  Daily headaches and at least 8 migraines per day. She has a lot tension in her neck and masseters. Also Ajovy wearing off by the end of the month gave her emgality samples and if she likes that we can prescribe but will warn her about insurance which may force her to choose at some point. Amitriptyline at bedtime (see above sleep results)  Meds ordered this encounter  Medications  . Galcanezumab-gnlm (EMGALITY) 120 MG/ML SOAJ    Sig: Inject 120 mg into the skin every 30 (thirty) days.    Dispense:  4 mL    Refill:  0  . amitriptyline (ELAVIL) 10 MG tablet    Sig: Take 1-2 tablets (10-20 mg total) by mouth at bedtime. Try an hour prior to bed and make sure you get 8 hours in bed.    Dispense:  30 tablet    Refill:  3     Reviewed orally with patient, additionally signature is on file:  Botulism toxin has been approved by the Federal drug administration for treatment of chronic migraine. Botulism toxin does not cure chronic migraine and it may not be effective in some patients.  The administration of botulism toxin is accomplished by injecting a small amount of toxin into the muscles of the neck and head. Dosage must be titrated for each individual. Any benefits resulting from botulism toxin tend to wear off after 3 months with a repeat injection required if benefit is to be maintained. Injections are usually done every 3-4 months with maximum effect peak achieved by about 2 or 3  weeks. Botulism toxin is expensive and you should be sure of what costs you will incur resulting from the injection.  The side effects of botulism toxin use for chronic migraine may include:   -Transient, and usually mild, facial weakness with facial injections  -Transient, and usually mild, head or neck weakness with head/neck injections  -Reduction or loss of forehead facial animation due to forehead muscle weakness  -Eyelid drooping  -Dry eye  -Pain at the site of injection or bruising at the site of injection  -Double vision  -Potential unknown long term risks  Contraindications: You should not have Botox if you are pregnant, nursing, allergic to albumin, have an infection, skin condition, or muscle weakness at the site of the injection, or have myasthenia gravis, Lambert-Eaton syndrome, or ALS.  It is also possible that as with any injection, there may be an allergic reaction or no effect from the medication. Reduced effectiveness after repeated injections is sometimes seen and rarely infection at the injection site may occur. All care will be taken to prevent these side effects. If therapy is given over a long time, atrophy and wasting in the muscle injected may occur. Occasionally the patient's become refractory to treatment because they develop antibodies to the toxin. In this event, therapy needs to be modified.  I have read the above information and consent to the administration of botulism toxin.    BOTOX PROCEDURE NOTE FOR MIGRAINE HEADACHE  Contraindications and precautions discussed with patient(above). Aseptic procedure was observed and patient tolerated procedure. Procedure performed by Dr. Georgia Dom  The condition has existed for more than 6 months, and pt does not have a diagnosis of ALS, Myasthenia Gravis or Lambert-Eaton Syndrome.  Risks and benefits of injections discussed and pt agrees to proceed with the procedure.  Written consent obtained  These injections are  medically necessary. Pt  receives good benefits from these injections. These injections do not cause sedations or hallucinations which the oral therapies may cause.  Description of procedure:  The patient was placed in a sitting position. The standard protocol was used for Botox as follows, with 5 units of Botox injected at each site:   -Procerus muscle, midline injection  -Corrugator muscle, bilateral injection  -Frontalis muscle, bilateral injection, with 2 sites each side, medial injection was performed in the upper one third of the frontalis muscle, in the region vertical from the medial inferior edge of the superior orbital rim. The lateral injection was again in the upper one third of the forehead vertically above the lateral limbus of the cornea, 1.5 cm lateral to the medial injection site.  -Temporalis muscle injection, 4 sites, bilaterally. The first injection was 3 cm above the tragus of the ear, second injection site was 1.5 cm to 3 cm up from the first injection site in line with the tragus of the ear. The third injection site was 1.5-3 cm forward between the first 2 injection sites. The fourth injection site was 1.5 cm posterior to the second injection site.   -Occipitalis muscle injection, 3 sites, bilaterally. The first injection was done one half way between the occipital protuberance and the tip of the mastoid process behind the ear. The second injection site was done lateral and superior to the first, 1 fingerbreadth from the first injection. The third injection site was 1 fingerbreadth superiorly and medially from the first injection site.  -Cervical paraspinal muscle injection, 2 sites, bilateral knee first injection site was 1 cm from the midline of the cervical spine, 3 cm inferior to the lower border of the occipital protuberance. The second injection site was 1.5 cm superiorly and laterally to the first injection site.  -Trapezius muscle injection was performed at 3 sites,  bilaterally. The first injection site was in the upper trapezius muscle halfway between the inflection point of the neck, and the acromion. The second injection site was one half way between the acromion and the first injection site. The third injection was done between the first injection site and the inflection point of the neck.   Will return for repeat injection in 3 months.   200 units of Botox was used, any Botox not injected was wasted. The patient tolerated the procedure well, there were no complications of the above procedure.

## 2020-06-27 NOTE — Progress Notes (Signed)
You see this patient tomorrow, Tamara Moore: The arousals were noted as: 32 were spontaneous, 0 were associated with PLMs, 1 were associated with respiratory events. The Periodic Limb Movement (PLM) index was 0 and the PLM Arousal index was 0/hour.  Audio and video analysis did not show any abnormal or unusual movements, behaviors, phonations or vocalizations.  No Snoring was noted. EKG was in keeping with normal sinus rhythm (NSR).   IMPRESSION:               There was highly fragmented sleep, yet no physiological causes were identified. No apnea, snoring, hypoxia or PLMs.  These spontaneous arousals can be attributed to pain or discomfort in many autoimmune, post viral or rheumatological pain conditions.           RECOMMENDATIONS:  I see no possible intervention by sleep medicine.

## 2020-06-27 NOTE — Procedures (Signed)
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  She has a medical history of Anxiety, Chronic headaches, Depression, Hemorrhoids, History of kidney stones, Hypertension, Irritable bowel syndrome (IBS), Migraine, Osteoarthritis, Sjogren's syndrome ([place]), and Thyroid disease. Chief concern according to patient: " I typically wake up with a dull headache, not a migraine. I am rarely being woken by headaches" and:" I am very fatigued and very sleepy- all day long".  Sleep relevant medical history: Nocturia 1-3, no Tonsillectomy. Insomnia- difficulties falling asleep, history of RLS.   Family medical /sleep history: Her mother is the other family member on CPAP with OSA.   The patient endorsed the Epworth Sleepiness Scale at 15 points.   The patient's weight 219 pounds with a height of 69 (inches), resulting in a BMI of 32.3 kg/m2. The patient's neck circumference measured 15.5 inches.  CURRENT MEDICATIONS: Tylenol, Norvasc, Excedrin Migraine, Cymbalta, Ajovy, Plaquenil, Advil, Toprol-XL, Multivitamins, Maxalt-MLT, Imitrex   PROCEDURE:  This is a multichannel digital polysomnogram utilizing the Somnostar 11.2 system.  Electrodes and sensors were applied and monitored per AASM Specifications.   EEG, EOG, Chin and Limb EMG, were sampled at 200 Hz.  ECG, Snore and Nasal Pressure, Thermal Airflow, Respiratory Effort, CPAP Flow and Pressure, Oximetry was sampled at 50 Hz. Digital video and audio were recorded.      BASELINE STUDY: Lights Out was at 22:05 and Lights On at 05:04.  Total recording time (TRT) was 419.5 minutes, with a total sleep time (TST) of 277.5 minutes.   The patient's sleep latency was 78.5  minutes.  REM latency was 60.5 minutes.  The sleep efficiency was 66.2 %.     SLEEP ARCHITECTURE: WASO (Wake after sleep onset) was 77 minutes.  There were 7.5 minutes in Stage N1, 147.5 minutes Stage N2, 29 minutes Stage N3 and 93.5 minutes in Stage REM.  The percentage of Stage N1 was 2.7%, Stage N2 was 53.2%, Stage N3 was 10.5% and Stage R (REM sleep) was 33.7%.   RESPIRATORY ANALYSIS:  There were a total of 12 respiratory events:  1 obstructive apnea, 0 central apneas and 1 mixed apnea with a total of 2 apneas and an apnea index (AI) of .4 /hour. There were 10 hypopneas with a hypopnea index of 2.2 /hour.      The total APNEA/HYPOPNEA INDEX (AHI) was 2.6/hour.  9 events occurred in REM sleep and 6 events in NREM. The REM AHI was 5.8 /hour, versus a non-REM AHI of 1.. The patient spent 125.5 minutes of total sleep time in the supine position and 152 minutes in non-supine. The supine AHI was 4.8 versus a non-supine AHI of 0.8/h.  OXYGEN SATURATION & C02:  The Wake baseline 02 saturation was 97%, with the lowest being 86%. Time spent below 89% saturation equaled 4 minutes.     The arousals were noted as: 32 were spontaneous, 0 were associated with PLMs, 1 were associated with respiratory events. The Periodic Limb Movement (PLM) index was 0 and the PLM Arousal index was 0/hour.  Audio and video analysis did not show any abnormal or unusual movements, behaviors, phonations or vocalizations.  No Snoring was noted.  EKG was in keeping with normal sinus rhythm (NSR).   IMPRESSION:               There was highly fragmented sleep, yet no physiological causes were identified. No apnea, snoring, hypoxia or PLMs.  These spontaneous arousals can be attributed to pain or discomfort in many autoimmune, post viral or rheumatological pain conditions.           RECOMMENDATIONS:  I see no possible intervention by sleep medicine.     I certify that I have reviewed the entire raw data recording prior to the  issuance of this report in accordance with the Standards of Accreditation of the American Academy of Sleep Medicine (AASM)   [name], MD Diplomat, American Board of Psychiatry and Neurology  Diplomat, American Board of Sleep Medicine Market researcher, [facility] [employer]

## 2020-06-28 ENCOUNTER — Telehealth: Payer: Self-pay | Admitting: Neurology

## 2020-06-28 ENCOUNTER — Ambulatory Visit (INDEPENDENT_AMBULATORY_CARE_PROVIDER_SITE_OTHER): Payer: 59 | Admitting: Neurology

## 2020-06-28 ENCOUNTER — Encounter: Payer: Self-pay | Admitting: Neurology

## 2020-06-28 DIAGNOSIS — G43711 Chronic migraine without aura, intractable, with status migrainosus: Secondary | ICD-10-CM | POA: Diagnosis not present

## 2020-06-28 MED ORDER — EMGALITY 120 MG/ML ~~LOC~~ SOAJ: 120.0000 mg | SUBCUTANEOUS | 0 refills | Status: DC

## 2020-06-28 MED ORDER — AMITRIPTYLINE HCL 10 MG PO TABS: 10.0000 mg | ORAL_TABLET | Freq: Every day | ORAL | 3 refills | Status: AC

## 2020-06-28 NOTE — Addendum Note (Signed)
Addended by: Sarina Ill B on: 06/28/2020 04:17 PM   Modules accepted: Orders

## 2020-06-28 NOTE — Patient Instructions (Signed)
Amitriptyline tablets What is this medicine? AMITRIPTYLINE (a mee TRIP ti leen) is used to treat depression. This medicine may be used for other purposes; ask your health care provider or pharmacist if you have questions. COMMON BRAND NAME(S): Elavil, Vanatrip What should I tell my health care provider before I take this medicine? They need to know if you have any of these conditions:  an alcohol problem  asthma, difficulty breathing  bipolar disorder or schizophrenia  difficulty passing urine, prostate trouble  glaucoma  heart disease or previous heart attack  liver disease  over active thyroid  seizures  thoughts or plans of suicide, a previous suicide attempt, or family history of suicide attempt  an unusual or allergic reaction to amitriptyline, other medicines, foods, dyes, or preservatives  pregnant or trying to get pregnant  breast-feeding How should I use this medicine? Take this medicine by mouth with a drink of water. Follow the directions on the prescription label. You can take the tablets with or without food. Take your medicine at regular intervals. Do not take it more often than directed. Do not stop taking this medicine suddenly except upon the advice of your doctor. Stopping this medicine too quickly may cause serious side effects or your condition may worsen. A special MedGuide will be given to you by the pharmacist with each prescription and refill. Be sure to read this information carefully each time. Talk to your pediatrician regarding the use of this medicine in children. Special care may be needed. Overdosage: If you think you have taken too much of this medicine contact a poison control center or emergency room at once. NOTE: This medicine is only for you. Do not share this medicine with others. What if I miss a dose? If you miss a dose, take it as soon as you can. If it is almost time for your next dose, take only that dose. Do not take double or  extra doses. What may interact with this medicine? Do not take this medicine with any of the following medications:  arsenic trioxide  certain medicines used to regulate abnormal heartbeat or to treat other heart conditions  cisapride  droperidol  halofantrine  linezolid  MAOIs like Carbex, Eldepryl, Marplan, Nardil, and Parnate  methylene blue  other medicines for mental depression  phenothiazines like perphenazine, thioridazine and chlorpromazine  pimozide  probucol  procarbazine  sparfloxacin  St. John's Wort This medicine may also interact with the following medications:  atropine and related drugs like hyoscyamine, scopolamine, tolterodine and others  barbiturate medicines for inducing sleep or treating seizures, like phenobarbital  cimetidine  disulfiram  ethchlorvynol  thyroid hormones such as levothyroxine  ziprasidone This list may not describe all possible interactions. Give your health care provider a list of all the medicines, herbs, non-prescription drugs, or dietary supplements you use. Also tell them if you smoke, drink alcohol, or use illegal drugs. Some items may interact with your medicine. What should I watch for while using this medicine? Tell your doctor if your symptoms do not get better or if they get worse. Visit your doctor or health care professional for regular checks on your progress. Because it may take several weeks to see the full effects of this medicine, it is important to continue your treatment as prescribed by your doctor. Patients and their families should watch out for new or worsening thoughts of suicide or depression. Also watch out for sudden changes in feelings such as feeling anxious, agitated, panicky, irritable, hostile, aggressive, impulsive,  severely restless, overly excited and hyperactive, or not being able to sleep. If this happens, especially at the beginning of treatment or after a change in dose, call your health  care professional. Dennis Bast may get drowsy or dizzy. Do not drive, use machinery, or do anything that needs mental alertness until you know how this medicine affects you. Do not stand or sit up quickly, especially if you are an older patient. This reduces the risk of dizzy or fainting spells. Alcohol may interfere with the effect of this medicine. Avoid alcoholic drinks. Do not treat yourself for coughs, colds, or allergies without asking your doctor or health care professional for advice. Some ingredients can increase possible side effects. Your mouth may get dry. Chewing sugarless gum or sucking hard candy, and drinking plenty of water will help. Contact your doctor if the problem does not go away or is severe. This medicine may cause dry eyes and blurred vision. If you wear contact lenses you may feel some discomfort. Lubricating drops may help. See your eye doctor if the problem does not go away or is severe. This medicine can cause constipation. Try to have a bowel movement at least every 2 to 3 days. If you do not have a bowel movement for 3 days, call your doctor or health care professional. This medicine can make you more sensitive to the sun. Keep out of the sun. If you cannot avoid being in the sun, wear protective clothing and use sunscreen. Do not use sun lamps or tanning beds/booths. What side effects may I notice from receiving this medicine? Side effects that you should report to your doctor or health care professional as soon as possible:  allergic reactions like skin rash, itching or hives, swelling of the face, lips, or tongue  anxious  breathing problems  changes in vision  confusion  elevated mood, decreased need for sleep, racing thoughts, impulsive behavior  eye pain  fast, irregular heartbeat  feeling faint or lightheaded, falls  feeling agitated, angry, or irritable  fever with increased sweating  hallucination, loss of contact with reality  seizures  stiff  muscles  suicidal thoughts or other mood changes  tingling, pain, or numbness in the feet or hands  trouble passing urine or change in the amount of urine  trouble sleeping  unusually weak or tired  vomiting  yellowing of the eyes or skin Side effects that usually do not require medical attention (report to your doctor or health care professional if they continue or are bothersome):  change in sex drive or performance  change in appetite or weight  constipation  dizziness  dry mouth  nausea  tired  tremors  upset stomach This list may not describe all possible side effects. Call your doctor for medical advice about side effects. You may report side effects to FDA at 1-800-FDA-1088. Where should I keep my medicine? Keep out of the reach of children. Store at room temperature between 20 and 25 degrees C (68 and 77 degrees F). Throw away any unused medicine after the expiration date. NOTE: This sheet is a summary. It may not cover all possible information. If you have questions about this medicine, talk to your doctor, pharmacist, or health care provider.  2020 Elsevier/Gold Standard (2018-10-07 13:04:32) Galcanezumab injection What is this medicine? GALCANEZUMAB (gal ka NEZ ue mab) is used to prevent migraines and treat cluster headaches. This medicine may be used for other purposes; ask your health care provider or pharmacist if you have questions.  COMMON BRAND NAME(S): Emgality What should I tell my health care provider before I take this medicine? They need to know if you have any of these conditions:  an unusual or allergic reaction to galcanezumab, other medicines, foods, dyes, or preservatives  pregnant or trying to get pregnant  breast-feeding How should I use this medicine? This medicine is for injection under the skin. You will be taught how to prepare and give this medicine. Use exactly as directed. Take your medicine at regular intervals. Do not take  your medicine more often than directed. It is important that you put your used needles and syringes in a special sharps container. Do not put them in a trash can. If you do not have a sharps container, call your pharmacist or healthcare provider to get one. Talk to your pediatrician regarding the use of this medicine in children. Special care may be needed. Overdosage: If you think you have taken too much of this medicine contact a poison control center or emergency room at once. NOTE: This medicine is only for you. Do not share this medicine with others. What if I miss a dose? If you miss a dose, take it as soon as you can. If it is almost time for your next dose, take only that dose. Do not take double or extra doses. What may interact with this medicine? Interactions are not expected. This list may not describe all possible interactions. Give your health care provider a list of all the medicines, herbs, non-prescription drugs, or dietary supplements you use. Also tell them if you smoke, drink alcohol, or use illegal drugs. Some items may interact with your medicine. What should I watch for while using this medicine? Tell your doctor or healthcare professional if your symptoms do not start to get better or if they get worse. What side effects may I notice from receiving this medicine? Side effects that you should report to your doctor or health care professional as soon as possible:  allergic reactions like skin rash, itching or hives, swelling of the face, lips, or tongue Side effects that usually do not require medical attention (report these to your doctor or health care professional if they continue or are bothersome):  pain, redness, or irritation at site where injected This list may not describe all possible side effects. Call your doctor for medical advice about side effects. You may report side effects to FDA at 1-800-FDA-1088. Where should I keep my medicine? Keep out of the reach of  children. You will be instructed on how to store this medicine. Throw away any unused medicine after the expiration date on the label. NOTE: This sheet is a summary. It may not cover all possible information. If you have questions about this medicine, talk to your doctor, pharmacist, or health care provider.  2020 Elsevier/Gold Standard (2018-04-02 12:03:23)

## 2020-06-28 NOTE — Telephone Encounter (Signed)
Called patient to discuss sleep study results. No answer at this time. LVM for the patient to call back.   

## 2020-06-28 NOTE — Telephone Encounter (Signed)
-----   Message from [name], MD sent at 06/27/2020  4:49 PM EDT ----- You see this patient tomorrow, Tamara Moore: The arousals were noted as: 32 were spontaneous, 0 were associated with PLMs, 1 were associated with respiratory events. The Periodic Limb Movement (PLM) index was 0 and the PLM Arousal index was 0/hour.  Audio and video analysis did not show any abnormal or unusual movements, behaviors, phonations or vocalizations.  No Snoring was noted. EKG was in keeping with normal sinus rhythm (NSR).   IMPRESSION:               There was highly fragmented sleep, yet no physiological causes were identified. No apnea, snoring, hypoxia or PLMs.  These spontaneous arousals can be attributed to pain or discomfort in many autoimmune, post viral or rheumatological pain conditions.           RECOMMENDATIONS:  I see no possible intervention by sleep medicine.

## 2020-06-28 NOTE — Progress Notes (Signed)
Botox- 200 units x 1 vials Lot: [id number] Expiration: 01/2020 NDC: 5483-234-68  Bacteriostatic 0.9% Sodium Chloride- 70mL total Lot: [id number] Expiration: 11/07/2020 NDC: 8168-3870-65  Dx: M26.088 B/B

## 2020-06-29 ENCOUNTER — Telehealth: Payer: Self-pay | Admitting: Neurology

## 2020-06-29 NOTE — Telephone Encounter (Signed)
Hi there! When I went to get my new prescription of amitryptilline (sp??), the pharmacist warned against taking it while taking Duloxetine, saying it could cause heart rate issues. Can you please advise? Thank you!!  I reviewed her medications, she is on Cymbalta 60 mg since July 2020 at least or longer, was started on amitriptyline low-dose 10 mg since June 28, 2020  In theory, the combination therapy could potentially exacerbate the side effect,  But she has no underlying cardiac disease, starting such a low dose of amitriptyline, it is okay for her to try,

## 2020-06-29 NOTE — Telephone Encounter (Signed)
Noted thanks. I sent the patient a mychart message.

## 2020-07-05 ENCOUNTER — Other Ambulatory Visit: Payer: Self-pay | Admitting: Neurology

## 2020-07-05 DIAGNOSIS — G43711 Chronic migraine without aura, intractable, with status migrainosus: Secondary | ICD-10-CM

## 2020-07-06 ENCOUNTER — Telehealth: Payer: Self-pay | Admitting: Neurology

## 2020-07-06 NOTE — Telephone Encounter (Signed)
I called [facility] to check status of Botox order. Patient had to be B/B because the SP could not get her medication here on time. This was her first injection. She was set up with [facility]. Patient's next appointment is in December. I spoke with Barnabas Lister and he states they have been unable to reach patient to gain consent for shipment. I called the patient and LVM asking her to call [facility] to give consent for shipment.

## 2020-07-28 ENCOUNTER — Other Ambulatory Visit: Payer: Self-pay | Admitting: Family Medicine

## 2020-07-28 DIAGNOSIS — Z1231 Encounter for screening mammogram for malignant neoplasm of breast: Secondary | ICD-10-CM

## 2020-08-01 ENCOUNTER — Ambulatory Visit
Admission: RE | Admit: 2020-08-01 | Discharge: 2020-08-01 | Disposition: A | Discharge status: Home / Self Care | Payer: 59 | Source: Ambulatory Visit

## 2020-08-01 ENCOUNTER — Other Ambulatory Visit: Payer: Self-pay

## 2020-08-01 DIAGNOSIS — Z1231 Encounter for screening mammogram for malignant neoplasm of breast: Secondary | ICD-10-CM

## 2020-09-19 ENCOUNTER — Telehealth: Payer: Self-pay | Admitting: Family Medicine

## 2020-09-19 ENCOUNTER — Encounter: Payer: Self-pay | Admitting: Family Medicine

## 2020-09-19 NOTE — Telephone Encounter (Signed)
I wanted to confirm that patient is good to go for next Botox appt 12/6? She doesn't need to do anything else at this point, correct?

## 2020-09-20 NOTE — Telephone Encounter (Signed)
TY

## 2020-09-20 NOTE — Telephone Encounter (Signed)
I called [facility] to make sure everything is ready in the order. I spoke with Tammy, who states she received a rejection because Botox is not covered by the plan. I explained that this must be incorrect, because I have a PA on file. I gave her the PA information and she states she would forward it to the PA team.

## 2020-09-20 NOTE — Telephone Encounter (Signed)
It looks like I tried to contact the patient in September about getting her medication from the specialty pharmacy, but I was not able to reach her. I will send the patient a message today regarding getting her medication shipped here.

## 2020-09-26 NOTE — Telephone Encounter (Signed)
I called [facility] and spoke with Meryl Crutch to discuss Botox delivery. She states that the patient's profile seems to be incomplete. She advised me to fill out a new enrollment form and fax it in.

## 2020-09-28 NOTE — Telephone Encounter (Signed)
I called [facility] and spoke with Meriam to check status of Botox order. Meriam states the order is still processing and she advised me to call back in 24 hours.

## 2020-10-03 ENCOUNTER — Ambulatory Visit: Payer: Self-pay | Admitting: Family Medicine

## 2020-10-03 NOTE — Telephone Encounter (Signed)
Received fax from [facility] stating patient's Botox will arrive tomorrow, 12/7.

## 2020-10-04 NOTE — Telephone Encounter (Signed)
(  1) 200U vial of Botox delivered today from [facility]. 

## 2020-10-05 ENCOUNTER — Ambulatory Visit: Payer: Self-pay | Admitting: Family Medicine

## 2020-10-05 ENCOUNTER — Ambulatory Visit (INDEPENDENT_AMBULATORY_CARE_PROVIDER_SITE_OTHER): Payer: 59 | Admitting: Family Medicine

## 2020-10-05 ENCOUNTER — Encounter: Payer: Self-pay | Admitting: Family Medicine

## 2020-10-05 DIAGNOSIS — G43711 Chronic migraine without aura, intractable, with status migrainosus: Secondary | ICD-10-CM | POA: Diagnosis not present

## 2020-10-05 NOTE — Telephone Encounter (Signed)
Patient was seen for Botox today, 12/8. Received a refill request form with 12/7 delivery from [facility]. Patient signed this and I faxed it to [facility]. Requested 12/20/20 delivery for next Botox injection on 01/11/21.

## 2020-10-05 NOTE — Progress Notes (Signed)
Botox-200unitsx1 vials Lot: [id number] Expiration: 06/2023 NDC: 9728-2060-15   0.9% Sodium Chloride- 58mL total Lot: [id number] Expiration: 07/2022 NDC: 32761-470-92  Dx: [facility] SP  Consent signed

## 2020-10-05 NOTE — Progress Notes (Addendum)
This is her second procedure. She reports significant reduction in severity and intensity of migraines. She has not had a migraine in about 2-3 months. She continues to have mild tension headaches. 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    Consent Form Botulism Toxin Injection For Chronic Migraine    Reviewed orally with patient, additionally signature is on file:  Botulism toxin has been approved by the Federal drug administration for treatment of chronic migraine. Botulism toxin does not cure chronic migraine and it may not be effective in some patients.  The administration of botulism toxin is accomplished by injecting a small amount of toxin into the muscles of the neck and head. Dosage must be titrated for each individual. Any benefits resulting from botulism toxin tend to wear off after 3 months with a repeat injection required if benefit is to be maintained. Injections are usually done every 3-4 months with maximum effect peak achieved by about 2 or 3 weeks. Botulism toxin is expensive and you should be sure of what costs you will incur resulting from the injection.  The side effects of botulism toxin use for chronic migraine may include:   -Transient, and usually mild, facial weakness with facial injections  -Transient, and usually mild, head or neck weakness with head/neck injections  -Reduction or loss of forehead facial animation due to forehead muscle weakness  -Eyelid drooping  -Dry eye  -Pain at the site of injection or bruising at the site of injection  -Double vision  -Potential unknown long term risks   Contraindications: You should not have Botox if you are pregnant, nursing, allergic to albumin, have an infection, skin condition, or muscle weakness at the site of the injection, or have myasthenia gravis, Lambert-Eaton syndrome, or ALS.  It is also possible that as with any injection, there may be an allergic reaction or no effect from the medication. Reduced effectiveness  after repeated injections is sometimes seen and rarely infection at the injection site may occur. All care will be taken to prevent these side effects. If therapy is given over a long time, atrophy and wasting in the muscle injected may occur. Occasionally the patient's become refractory to treatment because they develop antibodies to the toxin. In this event, therapy needs to be modified.  I have read the above information and consent to the administration of botulism toxin.    BOTOX PROCEDURE NOTE FOR MIGRAINE HEADACHE  Contraindications and precautions discussed with patient(above). Aseptic procedure was observed and patient tolerated procedure. Procedure performed by Debbora Presto, FNP-C.   The condition has existed for more than 6 months, and pt does not have a diagnosis of ALS, Myasthenia Gravis or Lambert-Eaton Syndrome.  Risks and benefits of injections discussed and pt agrees to proceed with the procedure.  Written consent obtained  These injections are medically necessary. Pt  receives good benefits from these injections. These injections do not cause sedations or hallucinations which the oral therapies may cause.   Description of procedure:  The patient was placed in a sitting position. The standard protocol was used for Botox as follows, with 5 units of Botox injected at each site:  -Procerus muscle, midline injection  -Corrugator muscle, bilateral injection  -Frontalis muscle, bilateral injection, with 2 sites each side, medial injection was performed in the upper one third of the frontalis muscle, in the region vertical from the medial inferior edge of the superior orbital rim. The lateral injection was again in the upper one third of the forehead vertically  above the lateral limbus of the cornea, 1.5 cm lateral to the medial injection site.  -Temporalis muscle injection, 4 sites, bilaterally. The first injection was 3 cm above the tragus of the ear, second injection site was 1.5  cm to 3 cm up from the first injection site in line with the tragus of the ear. The third injection site was 1.5-3 cm forward between the first 2 injection sites. The fourth injection site was 1.5 cm posterior to the second injection site. 5th site laterally in the temporalis  muscleat the level of the outer canthus.  -Occipitalis muscle injection, 3 sites, bilaterally. The first injection was done one half way between the occipital protuberance and the tip of the mastoid process behind the ear. The second injection site was done lateral and superior to the first, 1 fingerbreadth from the first injection. The third injection site was 1 fingerbreadth superiorly and medially from the first injection site.  -Cervical paraspinal muscle injection, 2 sites, bilaterally. The first injection site was 1 cm from the midline of the cervical spine, 3 cm inferior to the lower border of the occipital protuberance. The second injection site was 1.5 cm superiorly and laterally to the first injection site.  -Trapezius muscle injection was performed at 3 sites, bilaterally. The first injection site was in the upper trapezius muscle halfway between the inflection point of the neck, and the acromion. The second injection site was one half way between the acromion and the first injection site. The third injection was done between the first injection site and the inflection point of the neck.  -masseters bilaterally   Will return for repeat injection in 3 months.   A total of 200 units of Botox was prepared, 155 units of Botox was injected as documented above, any Botox not injected was wasted. The patient tolerated the procedure well, there were no complications of the above procedure.  Made any corrections needed, and agree with procedure  Sarina Ill, MD [facility]

## 2020-11-29 ENCOUNTER — Encounter: Payer: Self-pay | Admitting: Family Medicine

## 2020-11-29 ENCOUNTER — Other Ambulatory Visit: Payer: Self-pay

## 2020-11-29 DIAGNOSIS — G43711 Chronic migraine without aura, intractable, with status migrainosus: Secondary | ICD-10-CM

## 2020-11-29 MED ORDER — EMGALITY 120 MG/ML ~~LOC~~ SOAJ: 120.0000 mg | SUBCUTANEOUS | 2 refills | Status: DC

## 2020-11-29 MED ORDER — EMGALITY 120 MG/ML ~~LOC~~ SOAJ
120.0000 mg | SUBCUTANEOUS | 2 refills | Status: AC
Start: 2020-11-29 — End: ?

## 2020-11-29 NOTE — Addendum Note (Signed)
Addended by: Gildardo Griffes on: 11/29/2020 03:38 PM   Modules accepted: Orders

## 2020-12-29 ENCOUNTER — Encounter: Payer: Self-pay | Admitting: *Deleted

## 2021-01-02 NOTE — Telephone Encounter (Signed)
I called [facility] and spoke with Sabino Donovan to check order status for 3/16 appointment. She states Botox TBD 3/8.

## 2021-01-03 NOTE — Telephone Encounter (Signed)
Received (1) 200 unit vial of Botox today from [facility]. 

## 2021-01-11 ENCOUNTER — Telehealth: Payer: Self-pay | Admitting: *Deleted

## 2021-01-11 ENCOUNTER — Ambulatory Visit: Payer: 59 | Admitting: Family Medicine

## 2021-01-11 DIAGNOSIS — G43711 Chronic migraine without aura, intractable, with status migrainosus: Secondary | ICD-10-CM | POA: Diagnosis not present

## 2021-01-11 NOTE — Progress Notes (Signed)
01/11/2021 ALL:  She returns for her third procedure. She feels that it has been beneficial. She continues Emgality and amitriptyline. Rizatriptan for abortive therapy. She has not had a migraine since last visit. She is very happy. She does feel that masseter injections help significantly, temporal injections not as much. She is living in [place]. Daughter at [place].    10/05/2020 ALL: This is her second procedure. She reports significant reduction in severity and intensity of migraines. She has not had a migraine in about 2-3 months. She continues to have mild tension headaches. 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    Consent Form Botulism Toxin Injection For Chronic Migraine    Reviewed orally with patient, additionally signature is on file:  Botulism toxin has been approved by the Federal drug administration for treatment of chronic migraine. Botulism toxin does not cure chronic migraine and it may not be effective in some patients.  The administration of botulism toxin is accomplished by injecting a small amount of toxin into the muscles of the neck and head. Dosage must be titrated for each individual. Any benefits resulting from botulism toxin tend to wear off after 3 months with a repeat injection required if benefit is to be maintained. Injections are usually done every 3-4 months with maximum effect peak achieved by about 2 or 3 weeks. Botulism toxin is expensive and you should be sure of what costs you will incur resulting from the injection.  The side effects of botulism toxin use for chronic migraine may include:   -Transient, and usually mild, facial weakness with facial injections  -Transient, and usually mild, head or neck weakness with head/neck injections  -Reduction or loss of forehead facial animation due to forehead muscle weakness  -Eyelid drooping  -Dry eye  -Pain at the site of injection or bruising at the site of injection  -Double vision  -Potential unknown long  term risks   Contraindications: You should not have Botox if you are pregnant, nursing, allergic to albumin, have an infection, skin condition, or muscle weakness at the site of the injection, or have myasthenia gravis, Lambert-Eaton syndrome, or ALS.  It is also possible that as with any injection, there may be an allergic reaction or no effect from the medication. Reduced effectiveness after repeated injections is sometimes seen and rarely infection at the injection site may occur. All care will be taken to prevent these side effects. If therapy is given over a long time, atrophy and wasting in the muscle injected may occur. Occasionally the patient's become refractory to treatment because they develop antibodies to the toxin. In this event, therapy needs to be modified.  I have read the above information and consent to the administration of botulism toxin.    BOTOX PROCEDURE NOTE FOR MIGRAINE HEADACHE  Contraindications and precautions discussed with patient(above). Aseptic procedure was observed and patient tolerated procedure. Procedure performed by Debbora Presto, FNP-C.   The condition has existed for more than 6 months, and pt does not have a diagnosis of ALS, Myasthenia Gravis or Lambert-Eaton Syndrome.  Risks and benefits of injections discussed and pt agrees to proceed with the procedure.  Written consent obtained  These injections are medically necessary. Pt  receives good benefits from these injections. These injections do not cause sedations or hallucinations which the oral therapies may cause.   Description of procedure:  The patient was placed in a sitting position. The standard protocol was used for Botox as follows, with 5 units of Botox injected  at each site:  -Procerus muscle, midline injection  -Corrugator muscle, bilateral injection  -Frontalis muscle, bilateral injection, with 2 sites each side, medial injection was performed in the upper one third of the frontalis  muscle, in the region vertical from the medial inferior edge of the superior orbital rim. The lateral injection was again in the upper one third of the forehead vertically above the lateral limbus of the cornea, 1.5 cm lateral to the medial injection site.  -Temporalis muscle injection, 4 sites, bilaterally. The first injection was 3 cm above the tragus of the ear, second injection site was 1.5 cm to 3 cm up from the first injection site in line with the tragus of the ear. The third injection site was 1.5-3 cm forward between the first 2 injection sites. The fourth injection site was 1.5 cm posterior to the second injection site. 5th site laterally in the temporalis  muscleat the level of the outer canthus.  -Occipitalis muscle injection, 3 sites, bilaterally. The first injection was done one half way between the occipital protuberance and the tip of the mastoid process behind the ear. The second injection site was done lateral and superior to the first, 1 fingerbreadth from the first injection. The third injection site was 1 fingerbreadth superiorly and medially from the first injection site.  -Cervical paraspinal muscle injection, 2 sites, bilaterally. The first injection site was 1 cm from the midline of the cervical spine, 3 cm inferior to the lower border of the occipital protuberance. The second injection site was 1.5 cm superiorly and laterally to the first injection site.  -Trapezius muscle injection was performed at 3 sites, bilaterally. The first injection site was in the upper trapezius muscle halfway between the inflection point of the neck, and the acromion. The second injection site was one half way between the acromion and the first injection site. The third injection was done between the first injection site and the inflection point of the neck.  -masseters bilaterally   Will return for repeat injection in 3 months.   A total of 200 units of Botox was prepared, 165 units of Botox was  injected as documented above, any Botox not injected was wasted. The patient tolerated the procedure well, there were no complications of the above procedure.  Made any corrections needed, and agree with procedure  Sarina Ill, MD [facility]

## 2021-01-11 NOTE — Telephone Encounter (Signed)
Emgality PA completed on Cover My Med[employer]. Key: [id number]. Awaiting determination from Rx Benefits.

## 2021-01-11 NOTE — Telephone Encounter (Signed)
Rx Benefits faxed approval letter to our office. Emgality approved from 01/11/21-01/10/22. Approval letter faxed to [employer]ay. Received a receipt of confirmation. Pt updated.

## 2021-01-11 NOTE — Progress Notes (Signed)
Botox- 200 units x 1 vial Lot: [id number] Expiration: 08/2023 NDC: 0932-3557-32   Bacteriostatic 0.9% Sodium Chloride- 73mL total [id number] Expiration: 11/2021 NDC: 37628-315-17  Dx: O16.073 [facility]

## 2021-03-31 ENCOUNTER — Encounter: Payer: Self-pay | Admitting: Family Medicine

## 2021-04-04 ENCOUNTER — Telehealth: Payer: Self-pay | Admitting: Family Medicine

## 2021-04-04 NOTE — Telephone Encounter (Signed)
Patient has a Botox appointment 6/20. I called [facility] [phone]) and spoke with [facility] to schedule Botox delivery. Botox TBD 6/16.

## 2021-04-11 NOTE — Telephone Encounter (Signed)
Received a call from [place], stating a PA is needed. I advised that we have PA #[id number] (05/12/20- 05/28/21). This PA has been cancelled, but no explanation was given. I called [employer] [phone]) and spoke with Joselyn regarding starting a new PA. She told me to fax updated records to [phone], attn [place]. Case #[id number].

## 2021-04-12 NOTE — Telephone Encounter (Signed)
Received call from [place] with [employer]. Joy states the PA on file is still good, and doesn't expire until 7/31. [place] #[id number]. She states if there are any issues to call her at [phone].

## 2021-04-13 NOTE — Telephone Encounter (Signed)
Received (1) 200 unit vial of Botox today from [facility]. 

## 2021-04-17 ENCOUNTER — Ambulatory Visit: Payer: 59 | Admitting: Family Medicine

## 2021-05-02 NOTE — Telephone Encounter (Signed)
Patient was unable to make her 6/20 appointment due to an unexpected death in her family. At that time, I offered her an appointment 8/3 at 2:00. She did not respond to that MyChart message. I called her today and LVM offering that appointment. Requested she call back to let me know.

## 2021-05-04 ENCOUNTER — Other Ambulatory Visit: Payer: Self-pay | Admitting: Neurology

## 2021-06-06 NOTE — Telephone Encounter (Signed)
I called patient and LVM today to discuss rescheduling Botox appointment. Her PA for Botox with [employer] expired on 7/31 (PA [id number]) new PA will need to be obtained before next appt. Her Botox from [facility] has already been delivered.  PA phone: [phone] (ext [phone]) Fax: [phone]

## 2021-06-07 NOTE — Telephone Encounter (Signed)
I called Joy with [employer] care management/[employer] @ [phone] regarding continuing PA for Botox. I faxed clinicals to [phone]. Joy states I will receive decision via fax.

## 2021-06-07 NOTE — Telephone Encounter (Signed)
Received approval. PA [id number] (06/06/21- 06/06/22).

## 2021-06-20 NOTE — Progress Notes (Signed)
06/21/2021 ALL: Tamara Moore returns for Botox. She had last procedure in 12/2020 and was doing great on Botox, Emgality, amitriptyline and rizatriptan. Since, she lost her father, unexpectedly. She has not filled Emgality due to it not being covered by insurance and not taking amitriptyline. She does use rizatriptan as needed. Having daily headaches with at least 8 migraines a month. Masseter injections do help with clenching. She wishes to resume Botox every 12 weeks then assess need for additional medications in future.   01/11/2021 ALL:  She returns for her third procedure. She feels that it has been beneficial. She continues Emgality and amitriptyline. Rizatriptan for abortive therapy. She has not had a migraine since last visit. She is very happy. She does feel that masseter injections help significantly, temporal injections not as much. She is living in [place]. Daughter at [place].   10/05/2020 ALL: This is her second procedure. She reports significant reduction in severity and intensity of migraines. She has not had a migraine in about 2-3 months. She continues to have mild tension headaches. 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    Consent Form Botulism Toxin Injection For Chronic Migraine    Reviewed orally with patient, additionally signature is on file:  Botulism toxin has been approved by the Federal drug administration for treatment of chronic migraine. Botulism toxin does not cure chronic migraine and it may not be effective in some patients.  The administration of botulism toxin is accomplished by injecting a small amount of toxin into the muscles of the neck and head. Dosage must be titrated for each individual. Any benefits resulting from botulism toxin tend to wear off after 3 months with a repeat injection required if benefit is to be maintained. Injections are usually done every 3-4 months with maximum effect peak achieved by about 2 or 3 weeks. Botulism toxin is expensive and you should  be sure of what costs you will incur resulting from the injection.  The side effects of botulism toxin use for chronic migraine may include:   -Transient, and usually mild, facial weakness with facial injections  -Transient, and usually mild, head or neck weakness with head/neck injections  -Reduction or loss of forehead facial animation due to forehead muscle weakness  -Eyelid drooping  -Dry eye  -Pain at the site of injection or bruising at the site of injection  -Double vision  -Potential unknown long term risks   Contraindications: You should not have Botox if you are pregnant, nursing, allergic to albumin, have an infection, skin condition, or muscle weakness at the site of the injection, or have myasthenia gravis, Lambert-Eaton syndrome, or ALS.  It is also possible that as with any injection, there may be an allergic reaction or no effect from the medication. Reduced effectiveness after repeated injections is sometimes seen and rarely infection at the injection site may occur. All care will be taken to prevent these side effects. If therapy is given over a long time, atrophy and wasting in the muscle injected may occur. Occasionally the patient's become refractory to treatment because they develop antibodies to the toxin. In this event, therapy needs to be modified.  I have read the above information and consent to the administration of botulism toxin.    BOTOX PROCEDURE NOTE FOR MIGRAINE HEADACHE  Contraindications and precautions discussed with patient(above). Aseptic procedure was observed and patient tolerated procedure. Procedure performed by Debbora Presto, FNP-C.   The condition has existed for more than 6 months, and pt does not have a  diagnosis of ALS, Myasthenia Gravis or Lambert-Eaton Syndrome.  Risks and benefits of injections discussed and pt agrees to proceed with the procedure.  Written consent obtained  These injections are medically necessary. Pt  receives good benefits  from these injections. These injections do not cause sedations or hallucinations which the oral therapies may cause.   Description of procedure:  The patient was placed in a sitting position. The standard protocol was used for Botox as follows, with 5 units of Botox injected at each site:  -Procerus muscle, midline injection  -Corrugator muscle, bilateral injection  -Frontalis muscle, bilateral injection, with 2 sites each side, medial injection was performed in the upper one third of the frontalis muscle, in the region vertical from the medial inferior edge of the superior orbital rim. The lateral injection was again in the upper one third of the forehead vertically above the lateral limbus of the cornea, 1.5 cm lateral to the medial injection site.  -Temporalis muscle injection, 4 sites, bilaterally. The first injection was 3 cm above the tragus of the ear, second injection site was 1.5 cm to 3 cm up from the first injection site in line with the tragus of the ear. The third injection site was 1.5-3 cm forward between the first 2 injection sites. The fourth injection site was 1.5 cm posterior to the second injection site. 5th site laterally in the temporalis  muscleat the level of the outer canthus.  -Occipitalis muscle injection, 3 sites, bilaterally. The first injection was done one half way between the occipital protuberance and the tip of the mastoid process behind the ear. The second injection site was done lateral and superior to the first, 1 fingerbreadth from the first injection. The third injection site was 1 fingerbreadth superiorly and medially from the first injection site.  -Cervical paraspinal muscle injection, 2 sites, bilaterally. The first injection site was 1 cm from the midline of the cervical spine, 3 cm inferior to the lower border of the occipital protuberance. The second injection site was 1.5 cm superiorly and laterally to the first injection site.  -Trapezius muscle  injection was performed at 3 sites, bilaterally. The first injection site was in the upper trapezius muscle halfway between the inflection point of the neck, and the acromion. The second injection site was one half way between the acromion and the first injection site. The third injection was done between the first injection site and the inflection point of the neck.  -masseters bilaterally   Will return for repeat injection in 3 months.   A total of 200 units of Botox was prepared, 165 units of Botox was injected as documented above, any Botox not injected was wasted. The patient tolerated the procedure well, there were no complications of the above procedure.

## 2021-06-21 ENCOUNTER — Ambulatory Visit: Payer: 59 | Admitting: Family Medicine

## 2021-06-21 ENCOUNTER — Encounter: Payer: Self-pay | Admitting: Family Medicine

## 2021-06-21 DIAGNOSIS — G43709 Chronic migraine without aura, not intractable, without status migrainosus: Secondary | ICD-10-CM

## 2021-06-21 NOTE — Progress Notes (Signed)
Botox- 200 units x 1 vial Lot: [id number] Expiration: 10/2023 NDC: [id number]  Bacteriostatic 0.9% Sodium Chloride- 62m total Lot: [id number]: 10/29/2022 NDC: [id number] Dx: [id number]

## 2021-07-19 ENCOUNTER — Ambulatory Visit: Payer: 59 | Admitting: Family Medicine

## 2021-09-27 NOTE — Telephone Encounter (Signed)
I called patient and LVM to remind her to call [facility] to give consent for Botox shipment.

## 2021-09-28 NOTE — Telephone Encounter (Signed)
I called patient and LVM again, advising pharmacy still needs consent & this shipment is needed by Monday for her appointment. If we do not receive the shipment, appointment will have to be rescheduled. Requested patient return my call or respond to my MyChart message. Advised patient I will not be in the office tomorrow.

## 2021-10-02 ENCOUNTER — Ambulatory Visit: Payer: 59 | Admitting: Family Medicine
